# Patient Record
Sex: Female | Born: 1970
Health system: Southern US, Community
[De-identification: ages and names within clinical notes are randomized; demographics above are authoritative.]

## PROBLEM LIST (undated history)

## (undated) HISTORY — PX: BACK SURGERY: SHX140

---

## 1998-03-25 ENCOUNTER — Encounter: Admission: RE | Admit: 1998-03-25 | Discharge: 1998-06-23 | Payer: Self-pay | Admitting: *Deleted

## 1998-05-08 ENCOUNTER — Inpatient Hospital Stay (HOSPITAL_COMMUNITY): Admission: AD | Admit: 1998-05-08 | Discharge: 1998-05-11 | Payer: Self-pay | Admitting: *Deleted

## 1999-05-11 ENCOUNTER — Ambulatory Visit (HOSPITAL_COMMUNITY): Admission: RE | Admit: 1999-05-11 | Discharge: 1999-05-11 | Payer: Self-pay | Admitting: Family Medicine

## 1999-05-11 ENCOUNTER — Encounter: Payer: Self-pay | Admitting: Family Medicine

## 1999-09-14 ENCOUNTER — Inpatient Hospital Stay (HOSPITAL_COMMUNITY): Admission: AD | Admit: 1999-09-14 | Discharge: 1999-09-16 | Payer: Self-pay | Admitting: Obstetrics and Gynecology

## 1999-09-14 ENCOUNTER — Encounter: Payer: Self-pay | Admitting: Obstetrics and Gynecology

## 1999-10-05 ENCOUNTER — Inpatient Hospital Stay (HOSPITAL_COMMUNITY): Admission: AD | Admit: 1999-10-05 | Discharge: 1999-10-05 | Payer: Self-pay | Admitting: *Deleted

## 1999-10-12 ENCOUNTER — Inpatient Hospital Stay (HOSPITAL_COMMUNITY): Admission: AD | Admit: 1999-10-12 | Discharge: 1999-10-15 | Payer: Self-pay | Admitting: Obstetrics and Gynecology

## 2000-12-06 ENCOUNTER — Other Ambulatory Visit: Admission: RE | Admit: 2000-12-06 | Discharge: 2000-12-06 | Payer: Self-pay | Admitting: *Deleted

## 2000-12-23 ENCOUNTER — Other Ambulatory Visit: Admission: RE | Admit: 2000-12-23 | Discharge: 2000-12-23 | Payer: Self-pay | Admitting: *Deleted

## 2000-12-23 ENCOUNTER — Encounter (INDEPENDENT_AMBULATORY_CARE_PROVIDER_SITE_OTHER): Payer: Self-pay

## 2001-07-26 ENCOUNTER — Encounter: Payer: Self-pay | Admitting: Family Medicine

## 2001-07-26 ENCOUNTER — Ambulatory Visit (HOSPITAL_COMMUNITY): Admission: RE | Admit: 2001-07-26 | Discharge: 2001-07-26 | Payer: Self-pay | Admitting: Family Medicine

## 2001-12-08 ENCOUNTER — Other Ambulatory Visit: Admission: RE | Admit: 2001-12-08 | Discharge: 2001-12-08 | Payer: Self-pay | Admitting: *Deleted

## 2002-04-10 ENCOUNTER — Encounter: Payer: Self-pay | Admitting: Family Medicine

## 2002-04-10 ENCOUNTER — Ambulatory Visit (HOSPITAL_COMMUNITY): Admission: RE | Admit: 2002-04-10 | Discharge: 2002-04-10 | Payer: Self-pay | Admitting: Family Medicine

## 2002-04-15 ENCOUNTER — Encounter: Payer: Self-pay | Admitting: Urology

## 2002-04-15 ENCOUNTER — Ambulatory Visit (HOSPITAL_COMMUNITY): Admission: EM | Admit: 2002-04-15 | Discharge: 2002-04-15 | Payer: Self-pay | Admitting: Emergency Medicine

## 2002-12-19 ENCOUNTER — Other Ambulatory Visit: Admission: RE | Admit: 2002-12-19 | Discharge: 2002-12-19 | Payer: Self-pay | Admitting: Obstetrics and Gynecology

## 2003-12-30 ENCOUNTER — Other Ambulatory Visit: Admission: RE | Admit: 2003-12-30 | Discharge: 2003-12-30 | Payer: Self-pay | Admitting: Obstetrics and Gynecology

## 2008-07-11 ENCOUNTER — Emergency Department (HOSPITAL_COMMUNITY): Admission: EM | Admit: 2008-07-11 | Discharge: 2008-07-11 | Payer: Self-pay | Admitting: Emergency Medicine

## 2009-07-11 ENCOUNTER — Emergency Department (HOSPITAL_COMMUNITY): Admission: EM | Admit: 2009-07-11 | Discharge: 2009-07-11 | Payer: Self-pay | Admitting: Family Medicine

## 2009-08-01 ENCOUNTER — Ambulatory Visit (HOSPITAL_COMMUNITY): Admission: RE | Admit: 2009-08-01 | Discharge: 2009-08-01 | Payer: Self-pay | Admitting: Family Medicine

## 2009-09-29 ENCOUNTER — Encounter: Admission: RE | Admit: 2009-09-29 | Discharge: 2009-09-29 | Payer: Self-pay | Admitting: Family Medicine

## 2010-03-05 ENCOUNTER — Encounter: Payer: Self-pay | Admitting: Pulmonary Disease

## 2010-05-26 ENCOUNTER — Encounter: Payer: Self-pay | Admitting: Pulmonary Disease

## 2010-06-19 DIAGNOSIS — E7800 Pure hypercholesterolemia, unspecified: Secondary | ICD-10-CM | POA: Insufficient documentation

## 2010-06-19 DIAGNOSIS — E559 Vitamin D deficiency, unspecified: Secondary | ICD-10-CM | POA: Insufficient documentation

## 2010-06-19 DIAGNOSIS — J309 Allergic rhinitis, unspecified: Secondary | ICD-10-CM | POA: Insufficient documentation

## 2010-06-19 DIAGNOSIS — IMO0001 Reserved for inherently not codable concepts without codable children: Secondary | ICD-10-CM | POA: Insufficient documentation

## 2010-06-19 DIAGNOSIS — R799 Abnormal finding of blood chemistry, unspecified: Secondary | ICD-10-CM | POA: Insufficient documentation

## 2010-06-19 DIAGNOSIS — K5900 Constipation, unspecified: Secondary | ICD-10-CM | POA: Insufficient documentation

## 2010-06-19 DIAGNOSIS — N200 Calculus of kidney: Secondary | ICD-10-CM | POA: Insufficient documentation

## 2010-06-22 ENCOUNTER — Telehealth (INDEPENDENT_AMBULATORY_CARE_PROVIDER_SITE_OTHER): Payer: Self-pay | Admitting: *Deleted

## 2010-06-22 ENCOUNTER — Ambulatory Visit: Payer: Self-pay | Admitting: Pulmonary Disease

## 2010-06-22 DIAGNOSIS — R05 Cough: Secondary | ICD-10-CM

## 2010-06-22 DIAGNOSIS — R059 Cough, unspecified: Secondary | ICD-10-CM | POA: Insufficient documentation

## 2010-07-07 ENCOUNTER — Telehealth (INDEPENDENT_AMBULATORY_CARE_PROVIDER_SITE_OTHER): Payer: Self-pay | Admitting: *Deleted

## 2010-08-07 ENCOUNTER — Telehealth (INDEPENDENT_AMBULATORY_CARE_PROVIDER_SITE_OTHER): Payer: Self-pay | Admitting: *Deleted

## 2010-09-17 NOTE — Letter (Signed)
Summary: [facility]   Imported By: Sherian Rein 07/06/2010 08:19:22  _____________________________________________________________________  External Attachment:    Type:   Image     Comment:   External Document

## 2010-09-17 NOTE — Progress Notes (Signed)
Summary: MEDS-returning call--use cell #  Phone Note Call from Patient Call back at Home Phone [phone]   Caller: Patient Call For: [facility] Summary of Call: THE MEDS HELPED BUT SHOULD SHE CONTINUE ALL OF THEM Initial call taken by: Lacinda Axon,  July 07, 2010 3:58 PM  Follow-up for Phone Call        [facility] Vernie Murders  July 07, 2010 4:14 PM   Additional Follow-up for Phone Call Additional follow up Details #1::        PT RETURNING CALL.  Pt calling again w/ cell # [phone].  Please return call on cell# Lehman Prom  July 08, 2010 10:57 AM Additional Follow-up by: Rickard Patience,  July 08, 2010 9:19 AM    Additional Follow-up for Phone Call Additional follow up Details #2::    Spoke with pt and she states that her cough is much improved after using dexilant, prilosec, omnaris and chlorphen as directed. Pt wants to know if she has to continue all the medications, or can she stop some of them.  Pt states she does not wnat ot be on all these meds for the rest of her life. Please advise.Carron Curie CMA  July 08, 2010 3:23 PM   Additional Follow-up for Phone Call Additional follow up Details #3:: Details for Additional Follow-up Action Taken: would get her to stop nasal spray, but continue chlorpheniramine for another week.  If continues to do well, can take this just as needed for postnasal drip.  I think she needs to stay on something for reflux for a long period of time...can call in omeprazole 40mg  one each day.  If the cough returns, let me know. Additional Follow-up by: Barbaraann Share MD,  July 08, 2010 4:38 PM  New/Updated Medications: OMEPRAZOLE 40 MG CPDR (OMEPRAZOLE) take 1 by mouth once daily Prescriptions: OMEPRAZOLE 40 MG CPDR (OMEPRAZOLE) take 1 by mouth once daily  #30 x 5   Entered by:   Reynaldo Minium CMA   Authorized by:   Barbaraann Share MD   Signed by:   Reynaldo Minium CMA on 07/08/2010   Method used:   Electronically to        [facility]  [phone] (retail)       [address] [place], [place]  [address]       Ph: [phone] or [phone]       Fax: [phone]   RxID:   [id number]    Spoke with patient-aware of KC's recs and RX sent to pharmacy. Will call if cough returns.Reynaldo Minium CMA  July 08, 2010 4:47 PM

## 2010-09-17 NOTE — Assessment & Plan Note (Signed)
Summary: consult for chronic cough   Visit Type:  Initial Consult Copy to:  Holley Bouche MD Primary Provider/Referring Provider:  Holley Bouche MD  CC:  pulmonary consult. pt c/o dry cough x 1 year. pt states it happens at night in bed. .  History of Present Illness: the pt is a 39y/o female who I have been asked to see for chronic cough.  She has been having a dry cough for over one year, and states that it occurs almost exclusively at night upon lying down.  She begins to have a paroxysm once she lies down, and once her cough stops it doesn't occur anymore for the rest of the night.  She can have occasional cough during the day with heavy voice use.  She denies any postnasal drip or sinus pressure, and denies any breathing issues or h/o childhood asthma.  She does have some reflux symptoms, and has been on nexium for the last one month (but has run out).  It is unclear whether this helped or not.  She has had a cxr earlier in the year that was totally normal.    Current Medications (verified): 1)  Zovia 1/35e (28) 1-35 Mg-Mcg Tabs (Ethynodiol Diac-Eth Estradiol) .Marland Kitchen.. 1 Tablet Once Daily 2)  Nexium 40 Mg Cpdr (Esomeprazole Magnesium) .... Once Daily 3)  Budeprion Sr 150 Mg Xr12h-Tab (Bupropion Hcl) .... 1/2 Tablet Once Daily 4)  Benzonatate 100 Mg Caps (Benzonatate) .... One Tablet Three Times A Day As Needed For Cough  Allergies (verified): No Known Drug Allergies  Past History:  Risk Factors: Smoking Status: never (06/19/2010)  Past Medical History:  ANA POSITIVE (ICD-790.99)--seen by Levitin in the past. NEPHROLITHIASIS (ICD-592.0) PURE HYPERCHOLESTEROLEMIA (ICD-272.0) ALLERGIC RHINITIS CAUSE UNSPECIFIED (ICD-477.9)      Past Surgical History: back surgery laparoscopy  Family History: Reviewed history and no changes required. Stomach cancer--father lung cancer--father, pgm Breast cancer-- pgm  Social History: Reviewed history from 06/19/2010 and no changes  required. Patient never smoked.  married children--2 occupation--[employer]  Review of Systems       The patient complains of non-productive cough, anxiety, and joint stiffness or pain.  The patient denies shortness of breath with activity, shortness of breath at rest, productive cough, coughing up blood, chest pain, irregular heartbeats, acid heartburn, indigestion, loss of appetite, weight change, abdominal pain, difficulty swallowing, sore throat, tooth/dental problems, headaches, nasal congestion/difficulty breathing through nose, sneezing, itching, ear ache, depression, hand/feet swelling, rash, change in color of mucus, and fever.    Vital Signs:  Patient profile:   40 year old female Height:      66 inches Weight:      190.50 pounds BMI:     30.86 O2 Sat:      98 % on Room air Temp:     97.7 degrees F oral Pulse rate:   74 / minute BP sitting:   138 / 80  (left arm) Cuff size:   large  Vitals Entered By: Carver Fila (June 22, 2010 2:35 PM)  O2 Flow:  Room air CC: pulmonary consult. pt c/o dry cough x 1 year. pt states it happens at night in bed.  Comments meds and allergies updated Phone number updated Carver Fila  June 22, 2010 2:36 PM    Physical Exam  General:  ow female in nad  Eyes:  PERRLA and EOMI.   Nose:  patent without discharge no purulence seen Mouth:  clear, no lesions or exudates. Neck:  no jvd, tmg,  LN Lungs:  totally clear to auscultation Heart:  rrr, no mrg Abdomen:  soft and nontender, bs+ Extremities:  no edema noted, pulses intact distally no cyanosis  Neurologic:  alert and oriented, moves all 4.   Impression & Recommendations:  Problem # 1:  COUGH, CHRONIC (ICD-786.2)  the pt's cough is most c/w an upper rather than lower airway source by history.  The fact that it occurs almost exclusively in the evening on lying down makes postnasal drip and LPR the most likely culprits.  I would like to treat her for an upper airway cough  with more aggressive treatment of rhinitis and LPR.  Will also try behavioral techniques that help with a cyclical cough mechanism.  Medications Added to Medication List This Visit: 1)  Nexium 40 Mg Cpdr (Esomeprazole magnesium) .... Once daily 2)  Budeprion Sr 150 Mg Xr12h-tab (Bupropion hcl) .... 1/2 tablet once daily 3)  Benzonatate 100 Mg Caps (Benzonatate) .... One tablet three times a day as needed for cough  Other Orders: Consultation Level IV (44010)  Patient Instructions: 1)  take dexilant 60mg  one each night at bedtime.  Get prilosec otc 20mg  to take each am for the next 2 weeks. 2)  chlorpheniramine 8mg  each night at bedtime 3)  omnaris nasal spray 2 each nostril each am 4)  call me in 2 weeks to let me know if there is improvement.  I am looking for improvement here, not total resolution.   Immunization History:  Influenza Immunization History:    Influenza:  historical (05/16/2009)

## 2010-09-17 NOTE — Letter (Signed)
Summary: [facility]   Imported By: Sherian Rein 07/06/2010 08:17:26  _____________________________________________________________________  External Attachment:    Type:   Image     Comment:   External Document

## 2010-09-17 NOTE — Progress Notes (Signed)
Summary: talk to dr clance  Phone Note Call from Patient   Caller: Patient Call For: clance Summary of Call: pt want to no if she should stop taking medication that was given to her by her other drs. Initial call taken by: Rickard Patience,  June 22, 2010 4:37 PM  Follow-up for Phone Call        Spoke with pt and her question is she should contiue to take nexium and benzonate for her cough since you added new medicines at her visit Renold Genta RCP, LPN  June 22, 2010 4:54 PM   Additional Follow-up for Phone Call Additional follow up Details #1::        she told me she was out of nexium, so no, I don;t want her to take nexium.  see discharge instructions.  she can take tessalon pearls during the day if needed for cough Additional Follow-up by: Barbaraann Share MD,  June 22, 2010 4:58 PM    Additional Follow-up for Phone Call Additional follow up Details #2::    Spoke with pt and made her aware of medication instructions per Dr. Shelle Iron prilosec instead of nexium and tessalon perles only during the day. Renold Genta RCP, LPN  June 22, 2010 5:02 PM

## 2010-09-17 NOTE — Progress Notes (Signed)
Summary: dr for rhu arthritis  Phone Note Call from Patient   Caller: Patient Call For: clance Summary of Call: pt wants to know the name of the R[facility] dr that kc had recommended for her. [phone] Initial call taken by: Tivis Ringer, CNA,  August 07, 2010 12:52 PM  Follow-up for Phone Call        Pt states [facility] rec a rheumatologosts to her in the past and she cannot remember who it was. I advised KC out until tuesday but I will forward message to [facility] to advised on his return.Carron Curie CMA  August 07, 2010 1:04 PM  pt calling back regarding this message.  informed pt that Dr. Shelle Iron is out of the office until tomorrow.  pt was ok to wait until tomorrow to have message addressed.  Aundra Millet Reynolds LPN  August 11, 2010 10:22 AM   Additional Follow-up for Phone Call Additional follow up Details #1::        I am not sure who I have recommended in the past, but could consider Ozella Almond, or Orlin Hilding (who is in same practice as levitin who used to be her rheum.) Additional Follow-up by: Barbaraann Share MD,  August 12, 2010 2:04 PM    Additional Follow-up for Phone Call Additional follow up Details #2::    Spoke with pt and notified of the above recs per [facility].  Pt verbalized understanding. Follow-up by: Vernie Murders,  August 12, 2010 2:09 PM

## 2010-11-18 LAB — POCT URINALYSIS DIP (DEVICE)
Bilirubin Urine: NEGATIVE
Glucose, UA: NEGATIVE mg/dL
Nitrite: NEGATIVE
Protein, ur: 30 mg/dL — AB
Specific Gravity, Urine: >=1.03 (ref 1.005–1.030)
Urobilinogen, UA: 0.2 mg/dL (ref 0.0–1.0)
pH: 5.5 (ref 5.0–8.0)

## 2010-11-18 LAB — URINE CULTURE
Colony Count: NO GROWTH
Culture: NO GROWTH

## 2010-11-18 LAB — POCT PREGNANCY, URINE: Preg Test, Ur: NEGATIVE

## 2011-01-01 NOTE — Discharge Summary (Signed)
[facility]  Patient:    Allison Mullins, Allison Mullins                       MRN: [id number] Adm. Date:  [id number] Disc. Date: [id number] Attending:  Lenoard Aden                           Discharge Summary  ADMISSION DIAGNOSES:          1. Abnormal liver function tests.                               2. Intrahepatic cholestasis of pregnancy versus                                  early hemolysis, elevated liver enzymes, low                                  platelets syndrome.                               3. Intrauterine gestation at 36+ weeks.                               4. Positive Group B Strep culture.                               5. Positive antinuclear antibodies.  DISCHARGE DIAGNOSES:          1. Intrauterine gestation at 36+ weeks, delivered.                               2. Abnormal liver function tests.                               3. Intrahepatic cholestasis of pregnancy.  HISTORY OF PRESENT ILLNESS:   Please see Sung Amabile. Roslyn Smiling, M.D.s dictated history  and physical.  In essence, this is a 40 year old, gravida 4, para 1-0-2-1, female at 36+ weeks gestation admitted for induction of labor secondary to rising liver function tests and generalized pruritis.  The patients history is notable for a  positive ANA for which she has been on baby aspirin without any other laboratory studies to support antiphosphylipid antibody syndrome.  The patient had had several days history of generalized pruritis without any other etiology noted on evaluation.  LABORATORY DATA:              Liver studies of SGOT of 57, SGPT 64 on September 19, 1999.  Platelet count was in the 300,000 range.  The patient denied any clinical stigmata for preeclampsia.  She has been given cholestyramine, but the patient as unable to tolerate the medication.  On October 12, 1999, repeat labs were drawn and they were still noted to be remarkable for platelets normal, SGOT of  103, and SGPT of 119.  Her bile acid studies were still pending.  HOSPITAL COURSE:  The patient was admitted to [facility] on October 12, 1999.  Her blood pressure was 101/61.  Her platelet count on repeat was 301,000.  Her examination was notable for cervix being 1 cm, 50% effaced, -3 station, and posterior.  The patient had no clinical evidence that suggests pregnancy-induced hypertension.  She was started on penicillin prophylaxis.  Pitocin induction was begun.  This patient had planned an epidural anesthesia.  Tracing was reactive.  The patient was noted to have irregular contraction pattern.  Pitocin was continued.  On October 13, 1999, her cervix as 2 to 3 cm, 50% effaced, -2, and vertex presentation and blood pressures remained stable.  Repeat PIH labs were performed.  The labs showed platelet count of 249,000, hemoglobin 12.6, hematocrit 36.4, ALT of 164, ALP was 240, total bilirubin 1.4, uric acid 5.4.  AST was 144.  Creatinine was 0.8.  During the course of labor, the patient was still complaining of diffuse itching for which she received Nubane intravenously which helped to alleviate her symptoms.  The patient underwent artificial rupture of membranes and clear fluid was noted on October 13, 1999.  Pitocin was continued.  There was no indication for magnesium sulfate despite the elevated liver studies as the rest of the clinical presentation was not felt to be consistent with pregnancy-induced hypertension or preeclampsia.  The patient went into active labor and progressed quickly thereafter.  She became fully dilated t 10:30 a.m. and had a spontaneous vaginal delivery at 10:42 a.m. of a live female with a cord around the neck and body x 1.  Midline episiotomy was performed after a local anesthesia was utilized.  Apgars of 9 and 9.  Weight of the baby was 6 pounds 11 ounces.  Superficial markings in the periurethral area was  noted, but no evidence of bleeding, therefore, not reparative.  Episiotomy was without any extension and was repaired with 3-0 chromic in layers.  The baby was transferred to the nursery secondary to grunting.  Postpartum, the patient did well.  She continued to have itching which was responsive to Benadryl.  Repeat liver studies showed that the labs were decreasing.  The baby had been transferred to the NICU secondary to respiratory issues.  The patient who was rh negative received RhoGAM. Postpartum day #2, the patient was still complaining of itching, but was markedly decreased.  Repeat liver studies showed her AST of 70 down from a maximum of 144, ALT was 131 down from a maximum of 164.  The patient on postpartum day #2, otherwise doing well and examination that was unremarkable, was discharged to home.  DISPOSITION:                  Home.  CONDITION ON DISCHARGE:       Stable.  FOLLOW-UP:                    For repeat liver studies in one week in the office. Otherwise a postpartum visit in four to six weeks.  DISCHARGE INSTRUCTIONS:       Call for temperature greater than or equal to 100.4, soaking a regular pad every hour or more frequently, nothing per vagina for four to six weeks, call if headache not responsive to Tylenol, heartburn that is not responsive to TUMS, right upper quadrant pain, blurring of her vision, and swelling of the face, hands, and feet.  The baby remained in the NICU. DD:  11/08/99 TD:  11/09/99 Job: [id number]  [id number]

## 2011-01-01 NOTE — Op Note (Signed)
Allison Mullins, [place]                         ACCOUNT NO.:  [id number]   MEDICAL RECORD NO.:  [id number]                   PATIENT TYPE:  EMS   LOCATION:  ED                                   FACILITY:  [facility]   PHYSICIAN:  Lindaann Slough, M.D.               DATE OF BIRTH:  Aug 30, 1970   DATE OF PROCEDURE:  04/15/2002  DATE OF DISCHARGE:                                 OPERATIVE REPORT   PREOPERATIVE DIAGNOSIS:  Right ureteral stone.   POSTOPERATIVE DIAGNOSIS:  Right ureteral stone.   PROCEDURE DONE:  1. Cystoscopy.  2. Ureteroscopy with stone extraction.  3. Insertion of double-J catheter.   SURGEON:  Lindaann Slough, M.D.   ANESTHESIA:  General.   INDICATION:  The patient is a 40 year old female, who had severe right flank  pain associated with frequency and voiding small amount of urine at a time.  She was seen by her family physician four days ago for same symptoms.  A CT  scan then showed a 5 mm stone in the right and distal ureter.  A KUB done  today showed the stone in the same location.  Options were discussed with  the patient; continued conservative management versus stone extraction, and  she wishes to proceed with stone extraction.  The risks and benefits were  discussed with the patient and her husband.  They understand and are  agreeable.   DESCRIPTION OF PROCEDURE:  Under general anesthesia, the patient was prepped  and draped and placed into the dorsal lithotomy position.  A #22 Wappler  cystoscope was inserted in the bladder.  The bladder mucosa is normal.  There is no stone or tumor in the bladder.  The ureteral orifices are in  normal position and shape.  A guidewire was passed through the cystoscope  into the right ureter.  The cystoscope was then removed.  A #6.5 French  rigid ureteroscope was passed in the bladder and in the ureter.  A stone is  seen in the distal ureter, and a stone basket was passed through the  ureteroscope, and the stone was  extracted.  The ureteroscope was then re-  inserted in the ureter, and there was no evidence of ureteral injury.  The  ureteroscope was then removed.  The guidewire was then backloaded into the  cystoscope, and a #6 [place] - 24 double-J catheter was passed over the  guidewire with a proximal coil of the double-J catheter in the collecting  system.  The distal coil is in the bladder.  The bladder was then emptied and the cystoscope and guidewire were removed.   The patient tolerated the procedure well and left the OR in satisfactory  condition to postanesthesia care unit.  Lindaann Slough, M.D.    MN/MEDQ  D:  04/15/2002  T:  04/16/2002  Job:  [id number]

## 2011-01-30 ENCOUNTER — Other Ambulatory Visit: Payer: Self-pay | Admitting: Pulmonary Disease

## 2011-02-25 IMAGING — CR DG CHEST 2V
2 series · 2 of 2 positions shown · non-contrast
Comparison: None.

CLINICAL DATA: Cough

CHEST - 2 VIEW

[view not recorded (1 of 2)]
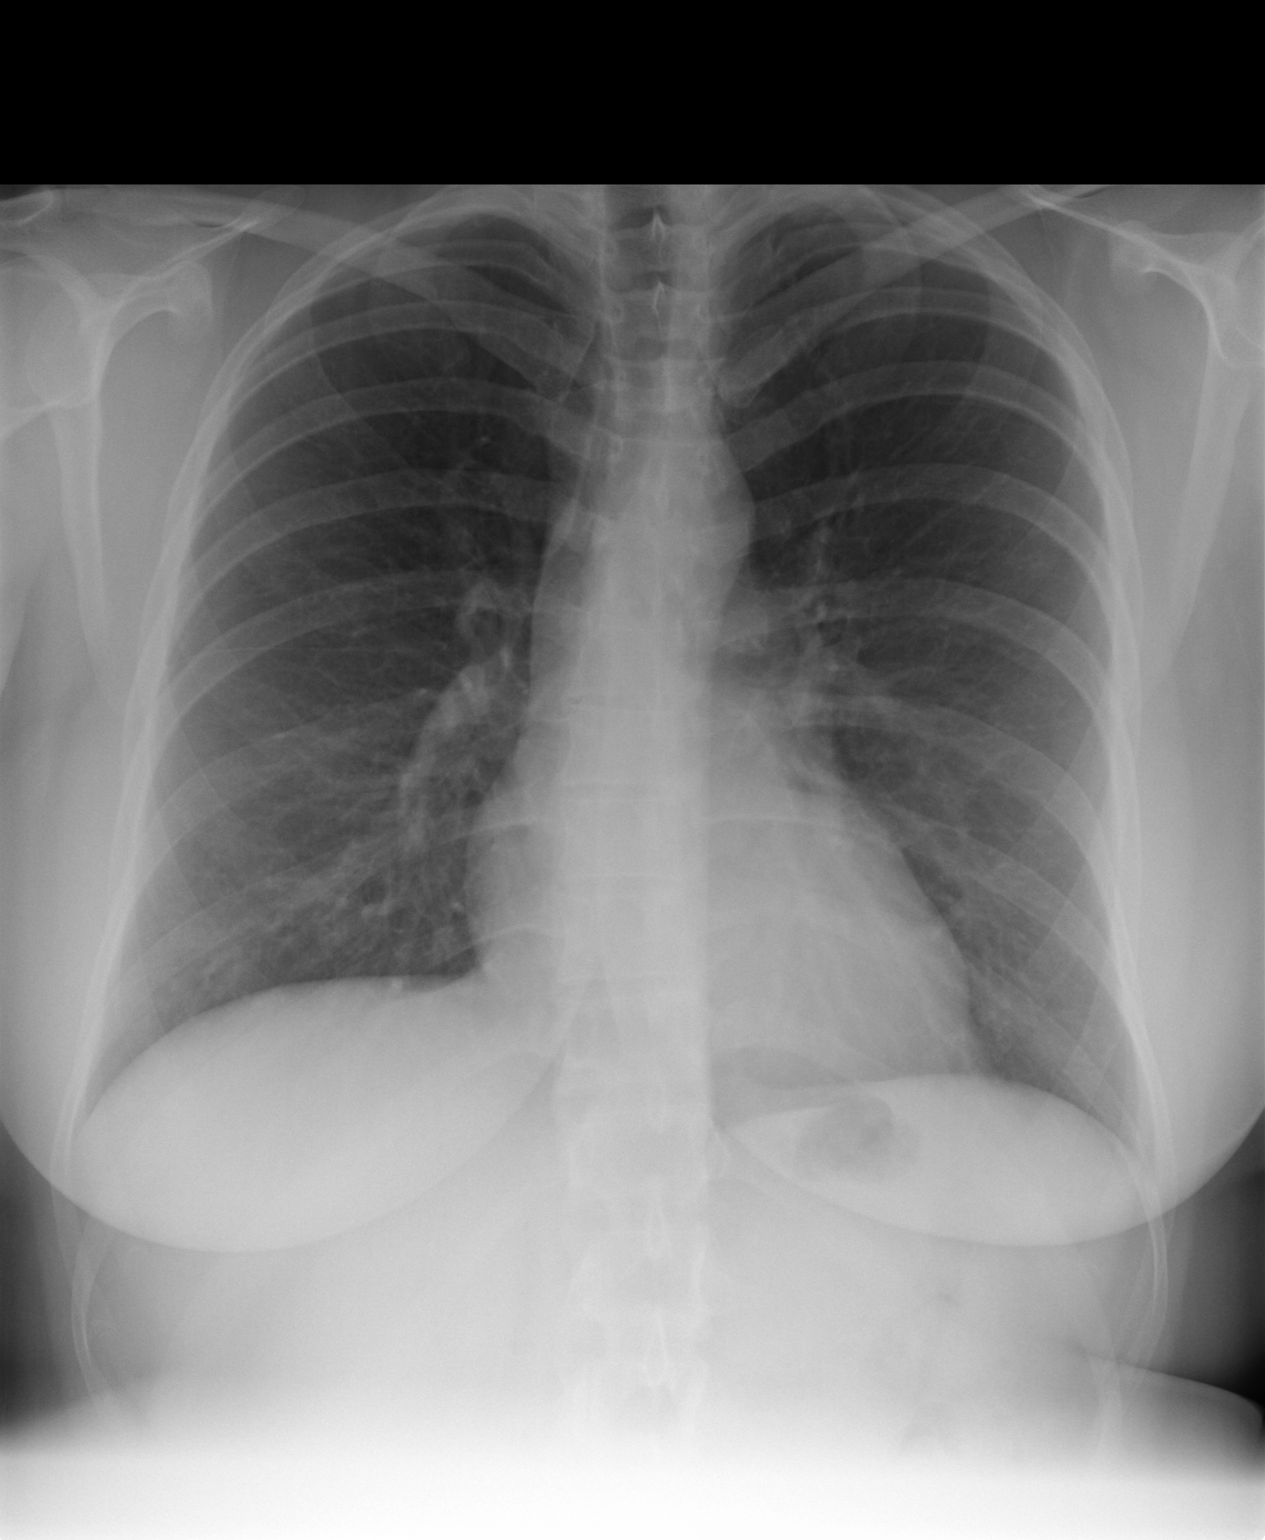

[view not recorded (2 of 2)]
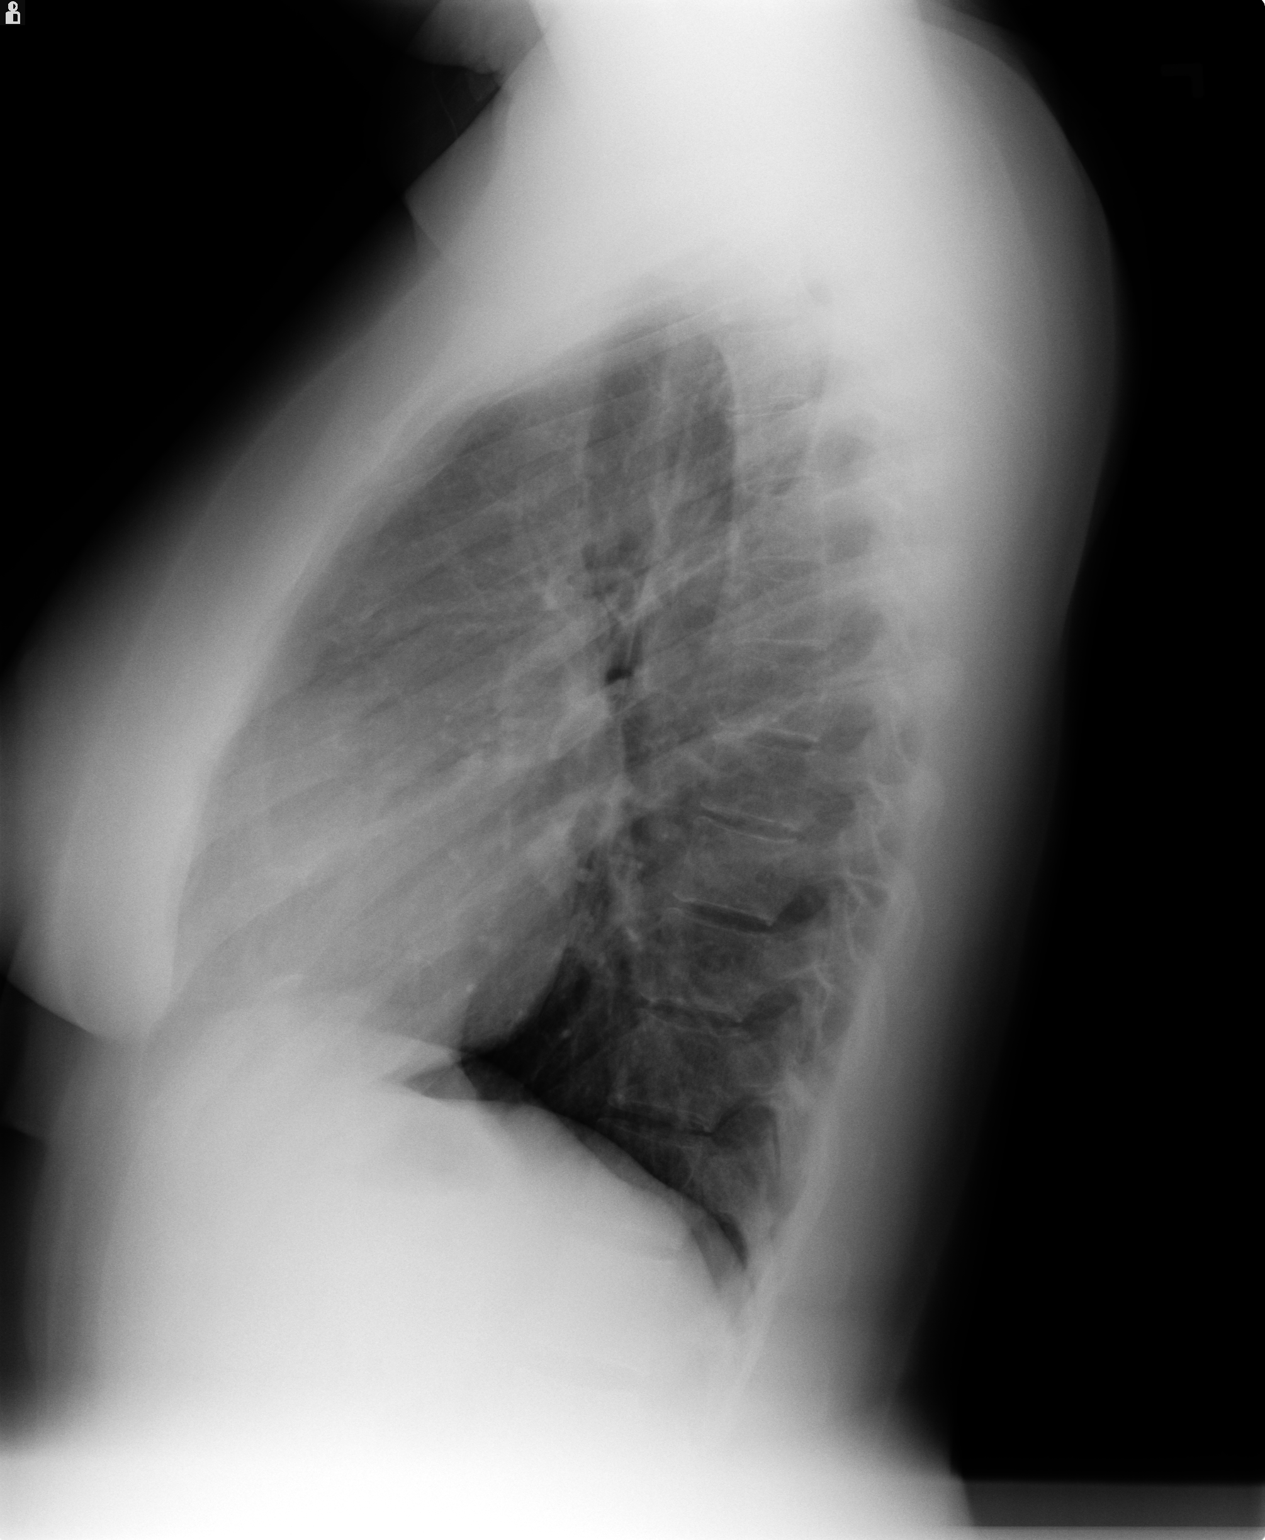

[2 of 2 positions shown; findings below may reference images not displayed]

FINDINGS: The heart size and mediastinal contours are within
normal limits.  Both lungs are clear.  The visualized skeletal
structures are unremarkable.
IMPRESSION: No active cardiopulmonary disease.

## 2011-08-06 ENCOUNTER — Other Ambulatory Visit: Payer: Self-pay | Admitting: Pulmonary Disease

## 2011-08-12 ENCOUNTER — Telehealth: Payer: Self-pay | Admitting: Pulmonary Disease

## 2011-08-12 MED ORDER — OMEPRAZOLE 40 MG PO CPDR: 40.0000 mg | DELAYED_RELEASE_CAPSULE | Freq: Every day | ORAL | Status: DC

## 2011-08-12 NOTE — Telephone Encounter (Signed)
Called and spoke with pt and she stated that the omeprazole is working for her.  She is requesting refills of this sent to the pharmacy.  Sent in #30 for the pt and she stated that she last saw [facility] in June when this was sent in and pt is requesting that the refills be sent in to last until June 2013.  KC please advise.  Pt is aware that [facility] out of the office until Monday.

## 2011-08-16 MED ORDER — OMEPRAZOLE 40 MG PO CPDR: 40.0000 mg | DELAYED_RELEASE_CAPSULE | Freq: Every day | ORAL | Status: DC

## 2011-08-16 NOTE — Telephone Encounter (Signed)
Ok with me if it is working.  So is this being denied?

## 2011-08-16 NOTE — Telephone Encounter (Signed)
I have called and added 6 refills to pt's rx. I have also fixed this in EPIC to show this. [email or web address] for pt aware

## 2011-08-18 NOTE — Telephone Encounter (Signed)
Pt aware. Denicia Pagliarulo, CMA  

## 2012-05-19 ENCOUNTER — Other Ambulatory Visit: Payer: Self-pay | Admitting: Pulmonary Disease

## 2012-05-23 ENCOUNTER — Other Ambulatory Visit: Payer: Self-pay | Admitting: Pulmonary Disease

## 2012-05-24 ENCOUNTER — Telehealth: Payer: Self-pay | Admitting: Pulmonary Disease

## 2012-05-24 MED ORDER — OMEPRAZOLE 40 MG PO CPDR: 40.0000 mg | DELAYED_RELEASE_CAPSULE | Freq: Every day | ORAL | Status: DC

## 2012-05-24 NOTE — Telephone Encounter (Signed)
Rx was refilled # 30 with no refills with note to pharmacist for the pt to keep appt for any future refills LMOM for the pt to be made aware

## 2012-05-24 NOTE — Telephone Encounter (Signed)
Pt has not been seen since 2011 and needs an appt for any refills.

## 2012-05-25 ENCOUNTER — Telehealth: Payer: Self-pay | Admitting: Pulmonary Disease

## 2012-05-25 MED ORDER — OMEPRAZOLE 40 MG PO CPDR: 40.0000 mg | DELAYED_RELEASE_CAPSULE | Freq: Every day | ORAL | Status: DC

## 2012-05-25 NOTE — Telephone Encounter (Signed)
Left a detailed msg to let the pt know her rx was sent to the pharmacy and apologized that this did not get taken care of for her yesterday. Told the pt there was no need for callback unless she has questions.

## 2012-06-22 ENCOUNTER — Encounter: Payer: Self-pay | Admitting: Pulmonary Disease

## 2012-06-22 ENCOUNTER — Ambulatory Visit (INDEPENDENT_AMBULATORY_CARE_PROVIDER_SITE_OTHER): Payer: Federal, State, Local not specified - PPO | Admitting: Pulmonary Disease

## 2012-06-22 VITALS — BP 116/82 | HR 76 | Temp 98.0°F | Ht 66.0 in | Wt 197.0 lb

## 2012-06-22 DIAGNOSIS — Z23 Encounter for immunization: Secondary | ICD-10-CM

## 2012-06-22 DIAGNOSIS — R059 Cough, unspecified: Secondary | ICD-10-CM

## 2012-06-22 DIAGNOSIS — R05 Cough: Secondary | ICD-10-CM

## 2012-06-22 MED ORDER — OMEPRAZOLE 40 MG PO CPDR: 40.0000 mg | DELAYED_RELEASE_CAPSULE | Freq: Every day | ORAL | Status: DC

## 2012-06-22 NOTE — Patient Instructions (Addendum)
Stay on omeprazole 40mg  each am, and will add dexilant 60mg  at bedtime for 2 weeks only.  Then go back to omeprazole once a day. Take chlorpheniramine 4mg   2 at bedtime for next 2 weeks, then can discontinue. If your cough does not resolve, please call me.  If it is doing well on once a day omeprazole, stay on this and see me back in one year.

## 2012-06-22 NOTE — Assessment & Plan Note (Signed)
The patient has a history of chronic cough that is felt to be upper airway in origin.  At her last visit, she was treated with a proton pump inhibitor and also for postnasal drip with resolution of her cough.  Her cough had been doing well until she ran out of her medication, but she has now been back on omeprazole for the last 4 weeks with no improvement in her cough.  I continue to believe this is upper airway in origin, and would like to work on more aggressive acid suppression and also behavioral therapies to prevent cyclical coughing.

## 2012-06-22 NOTE — Progress Notes (Signed)
  Subjective:    Patient ID: Allison Mullins, female    DOB: 28-Feb-1971, 41 y.o.   MRN: [id number]  HPI The patient comes in today for followup of her chronic cough, she has not been seen in 2 years, and has had a return of her cough.  At her first visit, she was treated with an antihistamine and aggressive acid suppression, with resolution of her cough.  She did well with once a day omeprazole until she ran out of the medication, with a return of her cough.  She has been back on the omeprazole for the last 4 weeks, but this has not been helping her symptoms.  This is primarily occurring at night upon lying down, and is clearly cyclical in character.  It is dry, and the patient admits that she clears her throat frequently.  She is unsure if she is having postnasal drip.   Review of Systems  Constitutional: Negative for fever and unexpected weight change.  HENT: Negative for ear pain, nosebleeds, congestion, sore throat, rhinorrhea, sneezing, trouble swallowing, dental problem, postnasal drip and sinus pressure.   Eyes: Negative for redness and itching.  Respiratory: Positive for cough. Negative for chest tightness, shortness of breath and wheezing.   Cardiovascular: Negative for palpitations and leg swelling.  Gastrointestinal: Negative for nausea and vomiting.  Genitourinary: Negative for dysuria.  Musculoskeletal: Negative for joint swelling.  Skin: Negative for rash.  Neurological: Negative for headaches.  Hematological: Does not bruise/bleed easily.  Psychiatric/Behavioral: Positive for dysphoric mood. The patient is not nervous/anxious.        Objective:   Physical Exam Obese female in no acute distress Nose without purulence or discharge noted Oropharynx clear Neck without lymphadenopathy or thyromegaly Chest totally clear to auscultation, no wheezing noted Cardiac exam was regular rate and rhythm Lower extremity without edema, no cyanosis Alert and oriented, moves all 4  extremities.       Assessment & Plan:

## 2013-04-15 ENCOUNTER — Emergency Department (HOSPITAL_BASED_OUTPATIENT_CLINIC_OR_DEPARTMENT_OTHER)
Admission: EM | Admit: 2013-04-15 | Discharge: 2013-04-15 | Disposition: A | Discharge status: Home / Self Care | Payer: Federal, State, Local not specified - PPO | Attending: Emergency Medicine | Admitting: Emergency Medicine

## 2013-04-15 ENCOUNTER — Encounter (HOSPITAL_BASED_OUTPATIENT_CLINIC_OR_DEPARTMENT_OTHER): Payer: Self-pay | Admitting: Emergency Medicine

## 2013-04-15 ENCOUNTER — Emergency Department (HOSPITAL_BASED_OUTPATIENT_CLINIC_OR_DEPARTMENT_OTHER): Payer: Federal, State, Local not specified - PPO

## 2013-04-15 DIAGNOSIS — S62639A Displaced fracture of distal phalanx of unspecified finger, initial encounter for closed fracture: Secondary | ICD-10-CM | POA: Insufficient documentation

## 2013-04-15 DIAGNOSIS — S62609A Fracture of unspecified phalanx of unspecified finger, initial encounter for closed fracture: Secondary | ICD-10-CM

## 2013-04-15 DIAGNOSIS — Y9361 Activity, american tackle football: Secondary | ICD-10-CM | POA: Insufficient documentation

## 2013-04-15 DIAGNOSIS — Y9229 Other specified public building as the place of occurrence of the external cause: Secondary | ICD-10-CM | POA: Insufficient documentation

## 2013-04-15 DIAGNOSIS — Z79899 Other long term (current) drug therapy: Secondary | ICD-10-CM | POA: Insufficient documentation

## 2013-04-15 DIAGNOSIS — W219XXA Striking against or struck by unspecified sports equipment, initial encounter: Secondary | ICD-10-CM | POA: Insufficient documentation

## 2013-04-15 NOTE — ED Provider Notes (Signed)
Medical screening examination/treatment/procedure(s) were performed by non-physician practitioner and as supervising physician I was immediately available for consultation/collaboration.  Kristen N Ward, DO 04/15/13 2312 

## 2013-04-15 NOTE — ED Provider Notes (Signed)
CSN: [id number]     Arrival date & time 04/15/13  1825 History   First MD Initiated Contact with Patient 04/15/13 1912     Chief Complaint  Patient presents with  . Finger Injury   (Consider location/radiation/quality/duration/timing/severity/associated sxs/prior Treatment) HPI  History reviewed. No pertinent past medical history. Past Surgical History  Procedure Laterality Date  . Back surgery     History reviewed. No pertinent family history. History  Substance Use Topics  . Smoking status: Never Smoker   . Smokeless tobacco: Not on file  . Alcohol Use: No   OB History   Grav Para Term Preterm Abortions TAB SAB Ect Mult Living                 Review of Systems  Allergies  Review of patient's allergies indicates no known allergies.  Home Medications   Current Outpatient Rx  Name  Route  Sig  Dispense  Refill  . buPROPion (WELLBUTRIN SR) 150 MG 12 hr tablet   Oral   Take 1 tablet by mouth Daily.         Colleen Can 1/20 1-20 MG-MCG tablet   Oral   Take 1 tablet by mouth Daily.         Marland Kitchen omeprazole (PRILOSEC) 40 MG capsule   Oral   Take 1 capsule (40 mg total) by mouth daily.   30 capsule   11    BP 130/65  Pulse 74  Temp(Src) 98 F (36.7 C)  Resp 18  SpO2 100% Physical Exam  ED Course  Procedures (including critical care time) Labs Review Labs Reviewed - No data to display Imaging Review Dg Hand Complete Left  04/15/2013   *RADIOLOGY REPORT*  Clinical Data: Football injury left hand.  LEFT HAND - COMPLETE 3+ VIEW  Comparison: Single view of the hands 09/23/2010.  Findings: There is dorsal plate avulsion fracture of the distal phalanx of the left ring finger.  The fracture is mildly distracted.  No other acute bony or joint abnormality is identified.  IMPRESSION: Dorsal plate avulsion fracture distal phalanx left ring finger.   Original Report Authenticated By: Holley Dexter, M.D.    MDM   1. Finger fracture, closed, initial encounter        Elson Areas, PA-C 04/15/13 1949

## 2013-04-15 NOTE — ED Notes (Signed)
Pt in radiology 

## 2013-04-15 NOTE — ED Provider Notes (Signed)
Medical screening examination/treatment/procedure(s) were performed by non-physician practitioner and as supervising physician I was immediately available for consultation/collaboration.  Anavey Coombes N Cortnie Ringel, DO 04/15/13 2312 

## 2013-04-15 NOTE — ED Notes (Signed)
Pt was throwing football, hit 4th finger of left hand, jammed, now with minimal swelling and limited ROM

## 2013-04-15 NOTE — ED Provider Notes (Signed)
CSN: [id number]     Arrival date & time 04/15/13  1825 History   First MD Initiated Contact with Patient 04/15/13 1912     Chief Complaint  Patient presents with  . Finger Injury   (Consider location/radiation/quality/duration/timing/severity/associated sxs/prior Treatment) Patient is a 42 y.o. female presenting with hand pain. The history is provided by the patient. No language interpreter was used.  Hand Pain This is a new problem. The current episode started today. The problem occurs constantly. The problem has been unchanged. Associated symptoms include joint swelling and myalgias. Nothing aggravates the symptoms. She has tried nothing for the symptoms. The treatment provided no relief.  Pt reports finger was hit by a football  History reviewed. No pertinent past medical history. Past Surgical History  Procedure Laterality Date  . Back surgery     History reviewed. No pertinent family history. History  Substance Use Topics  . Smoking status: Never Smoker   . Smokeless tobacco: Not on file  . Alcohol Use: No   OB History   Grav Para Term Preterm Abortions TAB SAB Ect Mult Living                 Review of Systems  Musculoskeletal: Positive for myalgias and joint swelling.  All other systems reviewed and are negative.    Allergies  Review of patient's allergies indicates no known allergies.  Home Medications   Current Outpatient Rx  Name  Route  Sig  Dispense  Refill  . buPROPion (WELLBUTRIN SR) 150 MG 12 hr tablet   Oral   Take 1 tablet by mouth Daily.         Colleen Can 1/20 1-20 MG-MCG tablet   Oral   Take 1 tablet by mouth Daily.         Marland Kitchen omeprazole (PRILOSEC) 40 MG capsule   Oral   Take 1 capsule (40 mg total) by mouth daily.   30 capsule   11    BP 130/65  Pulse 74  Temp(Src) 98 F (36.7 C)  Resp 18  SpO2 100% Physical Exam  Vitals reviewed. Constitutional: She is oriented to person, place, and time. She appears well-developed and  well-nourished.  Musculoskeletal: She exhibits tenderness.  Swollen distal tip left 4th finger,  nv and ns intact  Neurological: She is alert and oriented to person, place, and time. She has normal reflexes.  Skin: Skin is warm.  Psychiatric: She has a normal mood and affect.    ED Course  Procedures (including critical care time) Labs Review Labs Reviewed - No data to display Imaging Review Dg Hand Complete Left  04/15/2013   *RADIOLOGY REPORT*  Clinical Data: Football injury left hand.  LEFT HAND - COMPLETE 3+ VIEW  Comparison: Single view of the hands 09/23/2010.  Findings: There is dorsal plate avulsion fracture of the distal phalanx of the left ring finger.  The fracture is mildly distracted.  No other acute bony or joint abnormality is identified.  IMPRESSION: Dorsal plate avulsion fracture distal phalanx left ring finger.   Original Report Authenticated By: Holley Dexter, M.D.    MDM   1. Finger fracture, closed, initial encounter    Pt counseled on possibility of tendon injury.   Pt advised of need to see Ortho/Hand for evaluation,  Splint,  Pain meds declined.  Pt will take aleve    Elson Areas, PA-C 04/15/13 1940  Lonia Skinner [place], PA-C 04/15/13 1941  Lonia Skinner [place], PA-C 04/15/13 1942  Lonia Skinner  [place], [place] 04/15/13 1943

## 2013-06-15 ENCOUNTER — Other Ambulatory Visit: Payer: Self-pay | Admitting: Pulmonary Disease

## 2013-06-22 ENCOUNTER — Ambulatory Visit: Payer: Federal, State, Local not specified - PPO | Admitting: Pulmonary Disease

## 2013-07-25 ENCOUNTER — Ambulatory Visit: Payer: Federal, State, Local not specified - PPO | Admitting: Pulmonary Disease

## 2014-06-21 ENCOUNTER — Other Ambulatory Visit: Payer: Self-pay | Admitting: Pulmonary Disease

## 2016-02-02 DIAGNOSIS — D225 Melanocytic nevi of trunk: Secondary | ICD-10-CM | POA: Diagnosis not present

## 2016-02-02 DIAGNOSIS — Z85828 Personal history of other malignant neoplasm of skin: Secondary | ICD-10-CM | POA: Diagnosis not present

## 2016-02-02 DIAGNOSIS — L821 Other seborrheic keratosis: Secondary | ICD-10-CM | POA: Diagnosis not present

## 2016-05-03 DIAGNOSIS — Z23 Encounter for immunization: Secondary | ICD-10-CM | POA: Diagnosis not present

## 2016-05-03 DIAGNOSIS — R202 Paresthesia of skin: Secondary | ICD-10-CM | POA: Diagnosis not present

## 2016-05-03 DIAGNOSIS — R5383 Other fatigue: Secondary | ICD-10-CM | POA: Diagnosis not present

## 2016-05-03 DIAGNOSIS — F324 Major depressive disorder, single episode, in partial remission: Secondary | ICD-10-CM | POA: Diagnosis not present

## 2016-05-03 DIAGNOSIS — E559 Vitamin D deficiency, unspecified: Secondary | ICD-10-CM | POA: Diagnosis not present

## 2016-05-17 DIAGNOSIS — N926 Irregular menstruation, unspecified: Secondary | ICD-10-CM | POA: Diagnosis not present

## 2016-05-17 DIAGNOSIS — Z01419 Encounter for gynecological examination (general) (routine) without abnormal findings: Secondary | ICD-10-CM | POA: Diagnosis not present

## 2016-05-17 DIAGNOSIS — Z6835 Body mass index (BMI) 35.0-35.9, adult: Secondary | ICD-10-CM | POA: Diagnosis not present

## 2016-05-17 DIAGNOSIS — Z1231 Encounter for screening mammogram for malignant neoplasm of breast: Secondary | ICD-10-CM | POA: Diagnosis not present

## 2016-05-18 DIAGNOSIS — K080 Exfoliation of teeth due to systemic causes: Secondary | ICD-10-CM | POA: Diagnosis not present

## 2016-06-25 DIAGNOSIS — K080 Exfoliation of teeth due to systemic causes: Secondary | ICD-10-CM | POA: Diagnosis not present

## 2016-07-29 DIAGNOSIS — K080 Exfoliation of teeth due to systemic causes: Secondary | ICD-10-CM | POA: Diagnosis not present

## 2016-07-30 DIAGNOSIS — K080 Exfoliation of teeth due to systemic causes: Secondary | ICD-10-CM | POA: Diagnosis not present

## 2016-11-30 DIAGNOSIS — K080 Exfoliation of teeth due to systemic causes: Secondary | ICD-10-CM | POA: Diagnosis not present

## 2016-12-02 DIAGNOSIS — M79629 Pain in unspecified upper arm: Secondary | ICD-10-CM | POA: Diagnosis not present

## 2016-12-21 DIAGNOSIS — R590 Localized enlarged lymph nodes: Secondary | ICD-10-CM | POA: Diagnosis not present

## 2016-12-30 DIAGNOSIS — R590 Localized enlarged lymph nodes: Secondary | ICD-10-CM | POA: Diagnosis not present

## 2016-12-31 ENCOUNTER — Other Ambulatory Visit: Payer: Self-pay | Admitting: Family Medicine

## 2016-12-31 ENCOUNTER — Ambulatory Visit
Admission: RE | Admit: 2016-12-31 | Discharge: 2016-12-31 | Disposition: A | Discharge status: Home / Self Care | Payer: Federal, State, Local not specified - PPO | Source: Ambulatory Visit | Attending: Family Medicine | Admitting: Family Medicine

## 2016-12-31 DIAGNOSIS — R590 Localized enlarged lymph nodes: Secondary | ICD-10-CM

## 2017-01-04 DIAGNOSIS — R590 Localized enlarged lymph nodes: Secondary | ICD-10-CM | POA: Diagnosis not present

## 2017-01-07 ENCOUNTER — Other Ambulatory Visit: Payer: Self-pay | Admitting: Family Medicine

## 2017-01-07 DIAGNOSIS — R590 Localized enlarged lymph nodes: Secondary | ICD-10-CM

## 2017-01-11 ENCOUNTER — Other Ambulatory Visit: Payer: Self-pay | Admitting: Family Medicine

## 2017-01-11 DIAGNOSIS — R590 Localized enlarged lymph nodes: Secondary | ICD-10-CM

## 2017-01-14 ENCOUNTER — Ambulatory Visit
Admission: RE | Admit: 2017-01-14 | Discharge: 2017-01-14 | Disposition: A | Discharge status: Home / Self Care | Payer: Federal, State, Local not specified - PPO | Source: Ambulatory Visit | Attending: Family Medicine | Admitting: Family Medicine

## 2017-01-14 ENCOUNTER — Other Ambulatory Visit: Payer: Federal, State, Local not specified - PPO

## 2017-01-14 DIAGNOSIS — R590 Localized enlarged lymph nodes: Secondary | ICD-10-CM

## 2017-01-14 DIAGNOSIS — R229 Localized swelling, mass and lump, unspecified: Secondary | ICD-10-CM | POA: Diagnosis not present

## 2017-02-10 DIAGNOSIS — L821 Other seborrheic keratosis: Secondary | ICD-10-CM | POA: Diagnosis not present

## 2017-02-10 DIAGNOSIS — D2271 Melanocytic nevi of right lower limb, including hip: Secondary | ICD-10-CM | POA: Diagnosis not present

## 2017-02-10 DIAGNOSIS — M79629 Pain in unspecified upper arm: Secondary | ICD-10-CM | POA: Diagnosis not present

## 2017-02-10 DIAGNOSIS — D239 Other benign neoplasm of skin, unspecified: Secondary | ICD-10-CM | POA: Diagnosis not present

## 2017-03-01 DIAGNOSIS — Z6837 Body mass index (BMI) 37.0-37.9, adult: Secondary | ICD-10-CM | POA: Diagnosis not present

## 2017-03-01 DIAGNOSIS — R635 Abnormal weight gain: Secondary | ICD-10-CM | POA: Diagnosis not present

## 2017-03-01 DIAGNOSIS — E669 Obesity, unspecified: Secondary | ICD-10-CM | POA: Diagnosis not present

## 2017-03-01 DIAGNOSIS — R5382 Chronic fatigue, unspecified: Secondary | ICD-10-CM | POA: Diagnosis not present

## 2017-03-02 DIAGNOSIS — R635 Abnormal weight gain: Secondary | ICD-10-CM | POA: Diagnosis not present

## 2017-03-04 DIAGNOSIS — R635 Abnormal weight gain: Secondary | ICD-10-CM | POA: Diagnosis not present

## 2017-06-21 DIAGNOSIS — Z6835 Body mass index (BMI) 35.0-35.9, adult: Secondary | ICD-10-CM | POA: Diagnosis not present

## 2017-06-21 DIAGNOSIS — Z01419 Encounter for gynecological examination (general) (routine) without abnormal findings: Secondary | ICD-10-CM | POA: Diagnosis not present

## 2017-08-10 DIAGNOSIS — Z0000 Encounter for general adult medical examination without abnormal findings: Secondary | ICD-10-CM | POA: Diagnosis not present

## 2017-08-10 DIAGNOSIS — Z13220 Encounter for screening for lipoid disorders: Secondary | ICD-10-CM | POA: Diagnosis not present

## 2017-08-10 DIAGNOSIS — Z130 Encounter for screening for diseases of the blood and blood-forming organs and certain disorders involving the immune mechanism: Secondary | ICD-10-CM | POA: Diagnosis not present

## 2017-08-10 DIAGNOSIS — Z1329 Encounter for screening for other suspected endocrine disorder: Secondary | ICD-10-CM | POA: Diagnosis not present

## 2017-08-10 DIAGNOSIS — Z833 Family history of diabetes mellitus: Secondary | ICD-10-CM | POA: Diagnosis not present

## 2017-10-24 DIAGNOSIS — I10 Essential (primary) hypertension: Secondary | ICD-10-CM | POA: Diagnosis not present

## 2017-10-24 DIAGNOSIS — Z832 Family history of diseases of the blood and blood-forming organs and certain disorders involving the immune mechanism: Secondary | ICD-10-CM | POA: Diagnosis not present

## 2017-10-24 DIAGNOSIS — N183 Chronic kidney disease, stage 3 (moderate): Secondary | ICD-10-CM | POA: Diagnosis not present

## 2017-10-24 DIAGNOSIS — K219 Gastro-esophageal reflux disease without esophagitis: Secondary | ICD-10-CM | POA: Diagnosis not present

## 2018-02-23 DIAGNOSIS — K080 Exfoliation of teeth due to systemic causes: Secondary | ICD-10-CM | POA: Diagnosis not present

## 2018-03-13 DIAGNOSIS — M5136 Other intervertebral disc degeneration, lumbar region: Secondary | ICD-10-CM | POA: Diagnosis not present

## 2018-03-13 DIAGNOSIS — M545 Low back pain: Secondary | ICD-10-CM | POA: Diagnosis not present

## 2018-03-15 DIAGNOSIS — D2271 Melanocytic nevi of right lower limb, including hip: Secondary | ICD-10-CM | POA: Diagnosis not present

## 2018-03-15 DIAGNOSIS — D225 Melanocytic nevi of trunk: Secondary | ICD-10-CM | POA: Diagnosis not present

## 2018-03-15 DIAGNOSIS — Z85828 Personal history of other malignant neoplasm of skin: Secondary | ICD-10-CM | POA: Diagnosis not present

## 2018-03-15 DIAGNOSIS — D485 Neoplasm of uncertain behavior of skin: Secondary | ICD-10-CM | POA: Diagnosis not present

## 2018-03-15 DIAGNOSIS — D2372 Other benign neoplasm of skin of left lower limb, including hip: Secondary | ICD-10-CM | POA: Diagnosis not present

## 2018-04-29 DIAGNOSIS — M79671 Pain in right foot: Secondary | ICD-10-CM | POA: Diagnosis not present

## 2018-05-27 DIAGNOSIS — Z23 Encounter for immunization: Secondary | ICD-10-CM | POA: Diagnosis not present

## 2018-05-29 IMAGING — DX DG CHEST 2V
2 series · 2 of 2 positions shown · non-contrast
Comparison: None.

CLINICAL DATA: Swollen axillary lymph nodes for 3 months

EXAM:
CHEST  2 VIEW

[dg chest 2 view (1 of 2)]
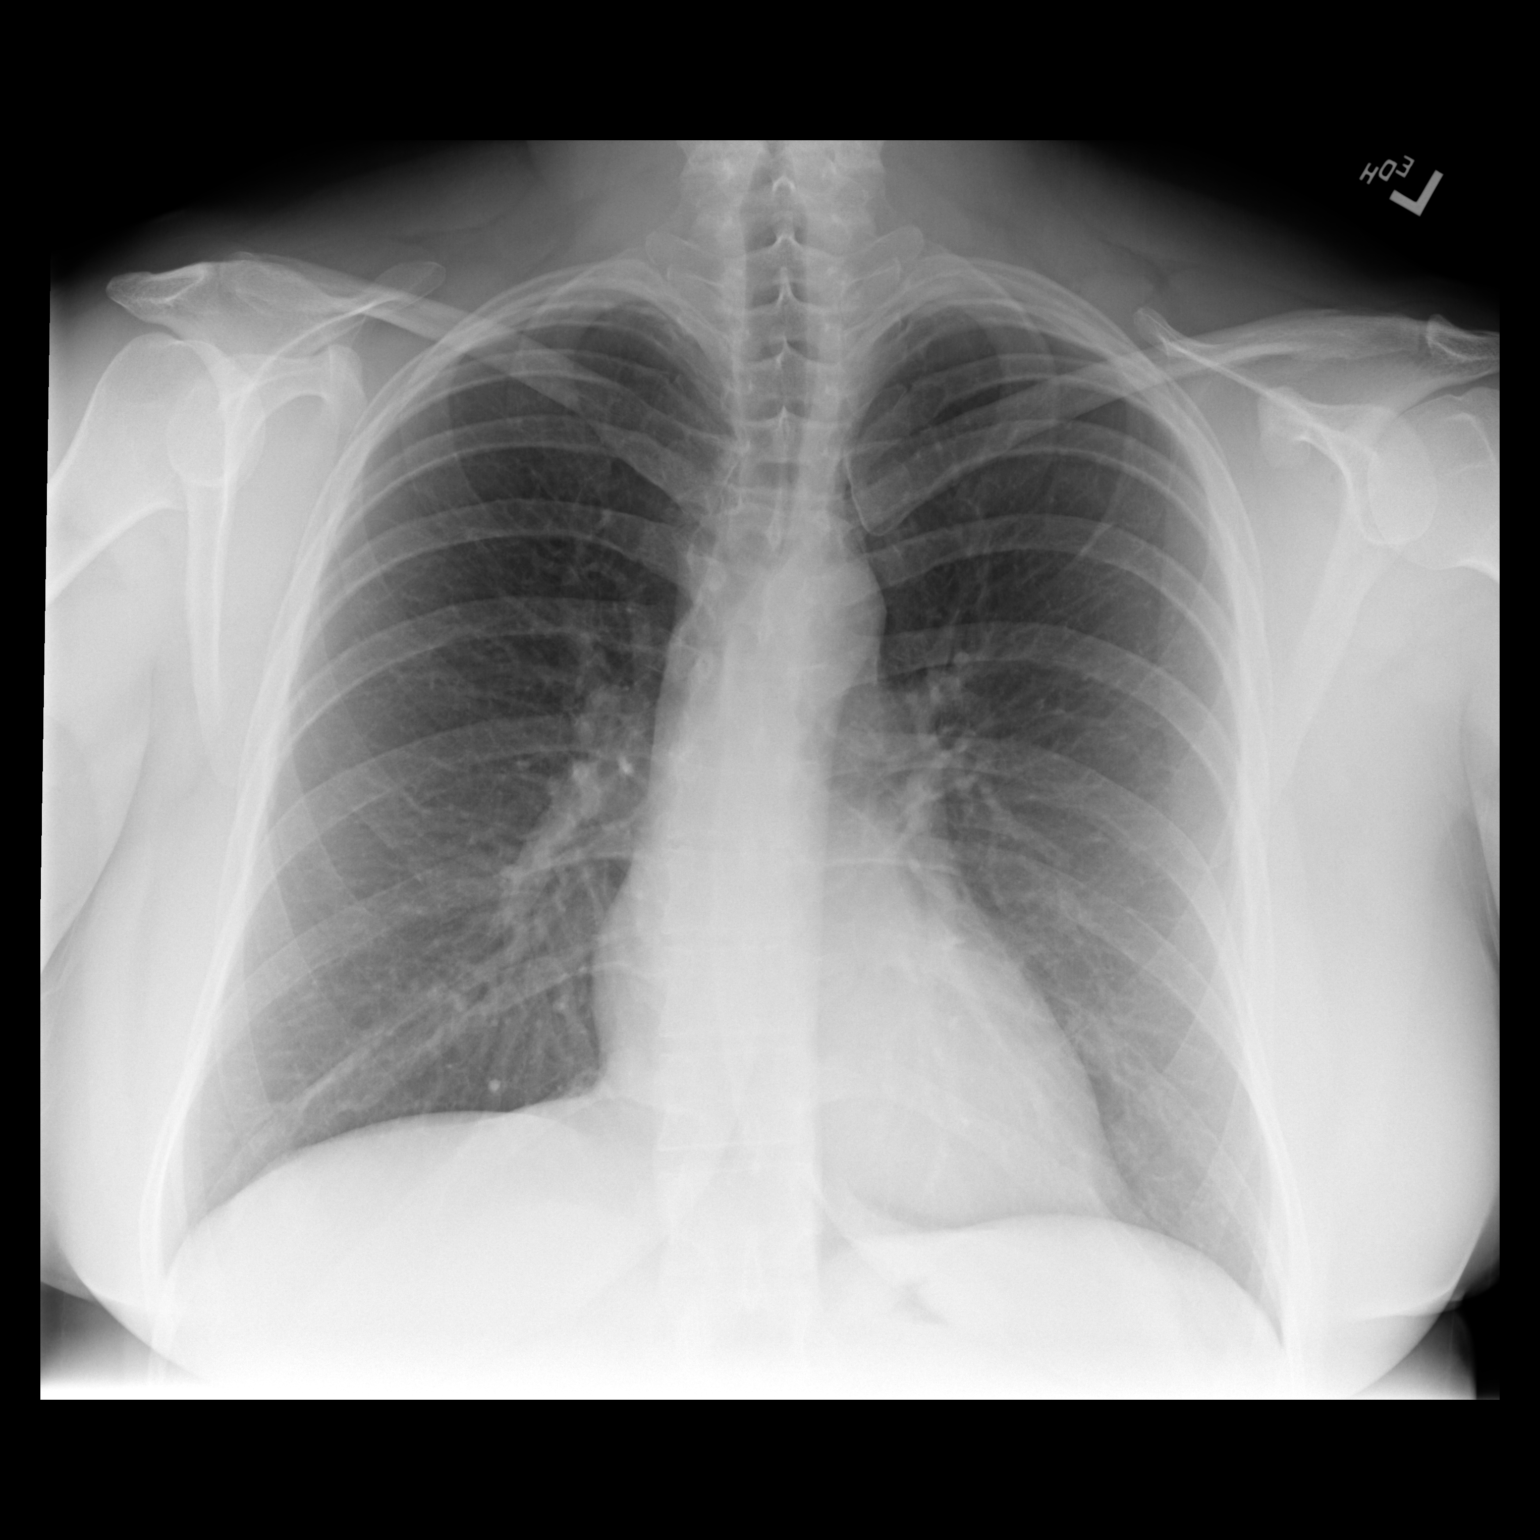

[dg chest 2 view (2 of 2)]
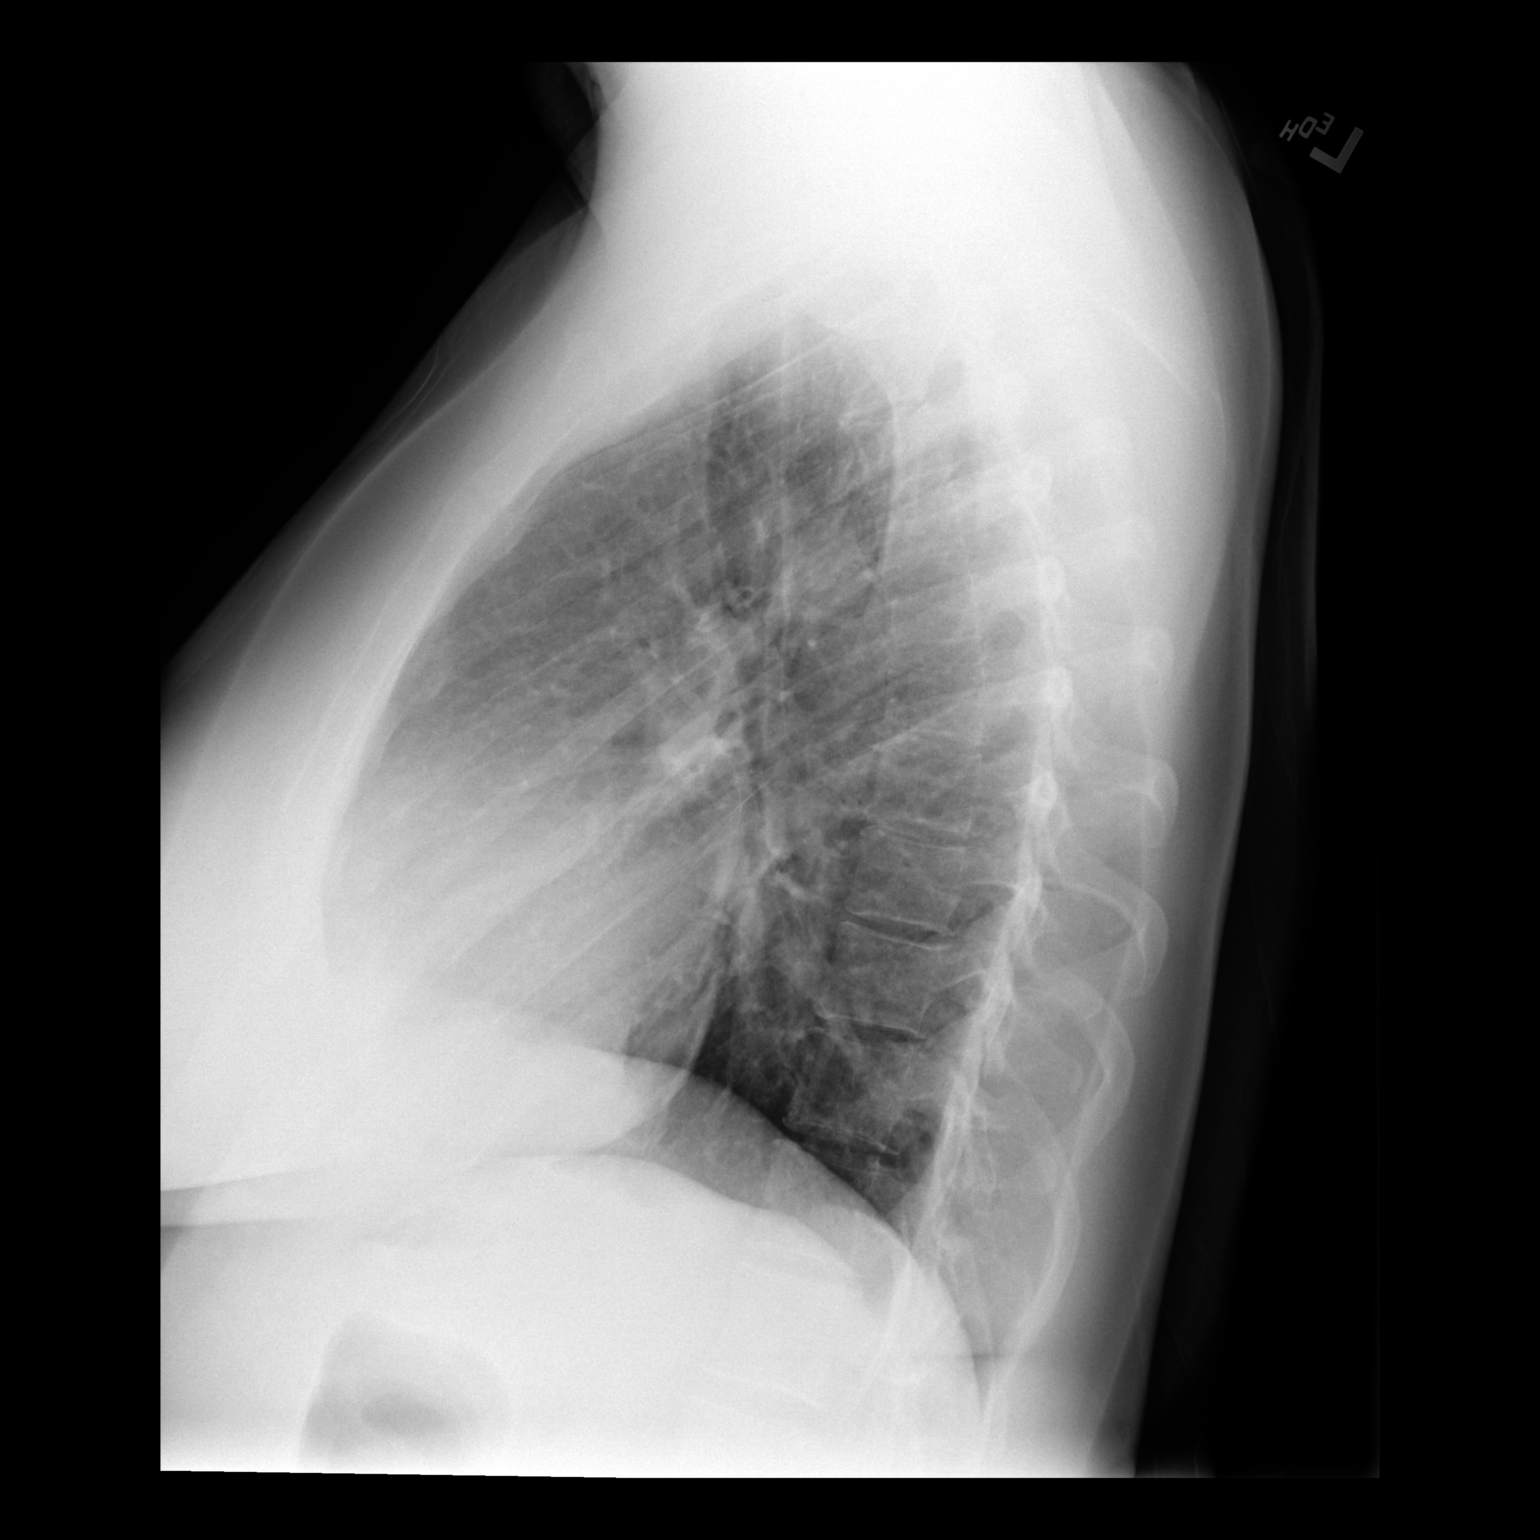

[2 of 2 positions shown; findings below may reference images not displayed]

FINDINGS: The heart size and mediastinal contours are within normal limits.
Both lungs are clear. The visualized skeletal structures are
unremarkable.
IMPRESSION: No active cardiopulmonary disease.

## 2018-06-22 DIAGNOSIS — Z1151 Encounter for screening for human papillomavirus (HPV): Secondary | ICD-10-CM | POA: Diagnosis not present

## 2018-06-22 DIAGNOSIS — Z01419 Encounter for gynecological examination (general) (routine) without abnormal findings: Secondary | ICD-10-CM | POA: Diagnosis not present

## 2018-06-22 DIAGNOSIS — Z1231 Encounter for screening mammogram for malignant neoplasm of breast: Secondary | ICD-10-CM | POA: Diagnosis not present

## 2018-06-22 DIAGNOSIS — Z6834 Body mass index (BMI) 34.0-34.9, adult: Secondary | ICD-10-CM | POA: Diagnosis not present

## 2018-06-28 DIAGNOSIS — H6692 Otitis media, unspecified, left ear: Secondary | ICD-10-CM | POA: Diagnosis not present

## 2018-08-10 DIAGNOSIS — K219 Gastro-esophageal reflux disease without esophagitis: Secondary | ICD-10-CM | POA: Diagnosis not present

## 2018-08-10 DIAGNOSIS — I10 Essential (primary) hypertension: Secondary | ICD-10-CM | POA: Diagnosis not present

## 2018-08-10 DIAGNOSIS — N183 Chronic kidney disease, stage 3 (moderate): Secondary | ICD-10-CM | POA: Diagnosis not present

## 2018-08-10 DIAGNOSIS — F324 Major depressive disorder, single episode, in partial remission: Secondary | ICD-10-CM | POA: Diagnosis not present

## 2018-08-23 DIAGNOSIS — K080 Exfoliation of teeth due to systemic causes: Secondary | ICD-10-CM | POA: Diagnosis not present

## 2018-09-15 DIAGNOSIS — N183 Chronic kidney disease, stage 3 (moderate): Secondary | ICD-10-CM | POA: Diagnosis not present

## 2018-09-15 DIAGNOSIS — Z13220 Encounter for screening for lipoid disorders: Secondary | ICD-10-CM | POA: Diagnosis not present

## 2018-09-15 DIAGNOSIS — I10 Essential (primary) hypertension: Secondary | ICD-10-CM | POA: Diagnosis not present

## 2018-10-17 DIAGNOSIS — K080 Exfoliation of teeth due to systemic causes: Secondary | ICD-10-CM | POA: Diagnosis not present

## 2019-03-21 DIAGNOSIS — M542 Cervicalgia: Secondary | ICD-10-CM | POA: Diagnosis not present

## 2019-03-21 DIAGNOSIS — M5412 Radiculopathy, cervical region: Secondary | ICD-10-CM | POA: Diagnosis not present

## 2019-03-21 DIAGNOSIS — M62830 Muscle spasm of back: Secondary | ICD-10-CM | POA: Diagnosis not present

## 2019-06-28 DIAGNOSIS — Z0000 Encounter for general adult medical examination without abnormal findings: Secondary | ICD-10-CM | POA: Diagnosis not present

## 2019-06-28 DIAGNOSIS — Z130 Encounter for screening for diseases of the blood and blood-forming organs and certain disorders involving the immune mechanism: Secondary | ICD-10-CM | POA: Diagnosis not present

## 2019-06-28 DIAGNOSIS — Z6834 Body mass index (BMI) 34.0-34.9, adult: Secondary | ICD-10-CM | POA: Diagnosis not present

## 2019-06-28 DIAGNOSIS — Z833 Family history of diabetes mellitus: Secondary | ICD-10-CM | POA: Diagnosis not present

## 2019-06-28 DIAGNOSIS — Z01419 Encounter for gynecological examination (general) (routine) without abnormal findings: Secondary | ICD-10-CM | POA: Diagnosis not present

## 2019-06-28 DIAGNOSIS — Z1231 Encounter for screening mammogram for malignant neoplasm of breast: Secondary | ICD-10-CM | POA: Diagnosis not present

## 2019-07-11 DIAGNOSIS — I10 Essential (primary) hypertension: Secondary | ICD-10-CM | POA: Diagnosis not present

## 2019-07-11 DIAGNOSIS — K219 Gastro-esophageal reflux disease without esophagitis: Secondary | ICD-10-CM | POA: Diagnosis not present

## 2019-07-11 DIAGNOSIS — E559 Vitamin D deficiency, unspecified: Secondary | ICD-10-CM | POA: Diagnosis not present

## 2019-07-11 DIAGNOSIS — N183 Chronic kidney disease, stage 3 unspecified: Secondary | ICD-10-CM | POA: Diagnosis not present

## 2019-07-11 DIAGNOSIS — R350 Frequency of micturition: Secondary | ICD-10-CM | POA: Diagnosis not present

## 2019-07-13 DIAGNOSIS — R350 Frequency of micturition: Secondary | ICD-10-CM | POA: Diagnosis not present

## 2019-10-29 DIAGNOSIS — S60450A Superficial foreign body of right index finger, initial encounter: Secondary | ICD-10-CM | POA: Diagnosis not present

## 2020-07-03 DIAGNOSIS — F324 Major depressive disorder, single episode, in partial remission: Secondary | ICD-10-CM | POA: Diagnosis not present

## 2020-07-03 DIAGNOSIS — E7800 Pure hypercholesterolemia, unspecified: Secondary | ICD-10-CM | POA: Diagnosis not present

## 2020-07-03 DIAGNOSIS — Z01419 Encounter for gynecological examination (general) (routine) without abnormal findings: Secondary | ICD-10-CM | POA: Diagnosis not present

## 2020-07-03 DIAGNOSIS — I10 Essential (primary) hypertension: Secondary | ICD-10-CM | POA: Diagnosis not present

## 2020-07-03 DIAGNOSIS — Z6835 Body mass index (BMI) 35.0-35.9, adult: Secondary | ICD-10-CM | POA: Diagnosis not present

## 2020-07-03 DIAGNOSIS — K219 Gastro-esophageal reflux disease without esophagitis: Secondary | ICD-10-CM | POA: Diagnosis not present

## 2020-07-03 DIAGNOSIS — Z1231 Encounter for screening mammogram for malignant neoplasm of breast: Secondary | ICD-10-CM | POA: Diagnosis not present

## 2020-07-25 DIAGNOSIS — S20211A Contusion of right front wall of thorax, initial encounter: Secondary | ICD-10-CM | POA: Diagnosis not present

## 2021-01-08 DIAGNOSIS — H6501 Acute serous otitis media, right ear: Secondary | ICD-10-CM | POA: Diagnosis not present

## 2021-01-08 DIAGNOSIS — H66002 Acute suppurative otitis media without spontaneous rupture of ear drum, left ear: Secondary | ICD-10-CM | POA: Diagnosis not present

## 2021-05-27 DIAGNOSIS — H93293 Other abnormal auditory perceptions, bilateral: Secondary | ICD-10-CM | POA: Diagnosis not present

## 2021-07-02 DIAGNOSIS — Z1231 Encounter for screening mammogram for malignant neoplasm of breast: Secondary | ICD-10-CM | POA: Diagnosis not present

## 2021-07-02 DIAGNOSIS — Z13220 Encounter for screening for lipoid disorders: Secondary | ICD-10-CM | POA: Diagnosis not present

## 2021-07-02 DIAGNOSIS — Z113 Encounter for screening for infections with a predominantly sexual mode of transmission: Secondary | ICD-10-CM | POA: Diagnosis not present

## 2021-07-02 DIAGNOSIS — Z0000 Encounter for general adult medical examination without abnormal findings: Secondary | ICD-10-CM | POA: Diagnosis not present

## 2021-07-02 DIAGNOSIS — Z124 Encounter for screening for malignant neoplasm of cervix: Secondary | ICD-10-CM | POA: Diagnosis not present

## 2021-07-02 DIAGNOSIS — Z01411 Encounter for gynecological examination (general) (routine) with abnormal findings: Secondary | ICD-10-CM | POA: Diagnosis not present

## 2021-07-02 DIAGNOSIS — Z1329 Encounter for screening for other suspected endocrine disorder: Secondary | ICD-10-CM | POA: Diagnosis not present

## 2021-07-02 DIAGNOSIS — Z01419 Encounter for gynecological examination (general) (routine) without abnormal findings: Secondary | ICD-10-CM | POA: Diagnosis not present

## 2021-07-02 DIAGNOSIS — Z6837 Body mass index (BMI) 37.0-37.9, adult: Secondary | ICD-10-CM | POA: Diagnosis not present

## 2021-07-02 DIAGNOSIS — Z131 Encounter for screening for diabetes mellitus: Secondary | ICD-10-CM | POA: Diagnosis not present

## 2021-07-06 ENCOUNTER — Other Ambulatory Visit: Payer: Self-pay | Admitting: Obstetrics and Gynecology

## 2021-07-06 DIAGNOSIS — R928 Other abnormal and inconclusive findings on diagnostic imaging of breast: Secondary | ICD-10-CM

## 2021-07-06 DIAGNOSIS — N6489 Other specified disorders of breast: Secondary | ICD-10-CM | POA: Diagnosis not present

## 2021-07-06 DIAGNOSIS — N6012 Diffuse cystic mastopathy of left breast: Secondary | ICD-10-CM | POA: Diagnosis not present

## 2021-08-06 ENCOUNTER — Ambulatory Visit
Admission: RE | Admit: 2021-08-06 | Discharge: 2021-08-06 | Disposition: A | Discharge status: Home / Self Care | Payer: Federal, State, Local not specified - PPO | Source: Ambulatory Visit | Attending: Obstetrics and Gynecology | Admitting: Obstetrics and Gynecology

## 2021-08-06 ENCOUNTER — Other Ambulatory Visit: Payer: Self-pay | Admitting: Obstetrics and Gynecology

## 2021-08-06 DIAGNOSIS — N644 Mastodynia: Secondary | ICD-10-CM | POA: Diagnosis not present

## 2021-08-06 DIAGNOSIS — R928 Other abnormal and inconclusive findings on diagnostic imaging of breast: Secondary | ICD-10-CM

## 2021-08-06 DIAGNOSIS — N6489 Other specified disorders of breast: Secondary | ICD-10-CM | POA: Diagnosis not present

## 2021-08-11 ENCOUNTER — Other Ambulatory Visit: Payer: Federal, State, Local not specified - PPO

## 2021-10-05 DIAGNOSIS — N183 Chronic kidney disease, stage 3 unspecified: Secondary | ICD-10-CM | POA: Diagnosis not present

## 2021-10-05 DIAGNOSIS — E7800 Pure hypercholesterolemia, unspecified: Secondary | ICD-10-CM | POA: Diagnosis not present

## 2021-10-05 DIAGNOSIS — I10 Essential (primary) hypertension: Secondary | ICD-10-CM | POA: Diagnosis not present

## 2021-10-05 DIAGNOSIS — F324 Major depressive disorder, single episode, in partial remission: Secondary | ICD-10-CM | POA: Diagnosis not present

## 2021-10-22 DIAGNOSIS — K080 Exfoliation of teeth due to systemic causes: Secondary | ICD-10-CM | POA: Diagnosis not present

## 2021-11-03 DIAGNOSIS — N951 Menopausal and female climacteric states: Secondary | ICD-10-CM | POA: Diagnosis not present

## 2021-11-11 DIAGNOSIS — R61 Generalized hyperhidrosis: Secondary | ICD-10-CM | POA: Diagnosis not present

## 2022-01-29 DIAGNOSIS — D225 Melanocytic nevi of trunk: Secondary | ICD-10-CM | POA: Diagnosis not present

## 2022-01-29 DIAGNOSIS — L820 Inflamed seborrheic keratosis: Secondary | ICD-10-CM | POA: Diagnosis not present

## 2022-01-29 DIAGNOSIS — L720 Epidermal cyst: Secondary | ICD-10-CM | POA: Diagnosis not present

## 2022-01-29 DIAGNOSIS — L821 Other seborrheic keratosis: Secondary | ICD-10-CM | POA: Diagnosis not present

## 2022-02-26 DIAGNOSIS — M25571 Pain in right ankle and joints of right foot: Secondary | ICD-10-CM | POA: Diagnosis not present

## 2022-02-26 DIAGNOSIS — E559 Vitamin D deficiency, unspecified: Secondary | ICD-10-CM | POA: Diagnosis not present

## 2022-02-26 DIAGNOSIS — M2550 Pain in unspecified joint: Secondary | ICD-10-CM | POA: Diagnosis not present

## 2022-02-26 DIAGNOSIS — Z780 Asymptomatic menopausal state: Secondary | ICD-10-CM | POA: Diagnosis not present

## 2022-03-22 DIAGNOSIS — M79671 Pain in right foot: Secondary | ICD-10-CM | POA: Diagnosis not present

## 2022-03-22 DIAGNOSIS — M79672 Pain in left foot: Secondary | ICD-10-CM | POA: Diagnosis not present

## 2022-03-22 DIAGNOSIS — M25561 Pain in right knee: Secondary | ICD-10-CM | POA: Diagnosis not present

## 2022-03-22 DIAGNOSIS — M25562 Pain in left knee: Secondary | ICD-10-CM | POA: Diagnosis not present

## 2022-05-12 DIAGNOSIS — G7249 Other inflammatory and immune myopathies, not elsewhere classified: Secondary | ICD-10-CM | POA: Diagnosis not present

## 2022-07-05 DIAGNOSIS — Z1231 Encounter for screening mammogram for malignant neoplasm of breast: Secondary | ICD-10-CM | POA: Diagnosis not present

## 2022-07-12 DIAGNOSIS — N898 Other specified noninflammatory disorders of vagina: Secondary | ICD-10-CM | POA: Diagnosis not present

## 2022-07-12 DIAGNOSIS — Z6837 Body mass index (BMI) 37.0-37.9, adult: Secondary | ICD-10-CM | POA: Diagnosis not present

## 2022-07-12 DIAGNOSIS — N760 Acute vaginitis: Secondary | ICD-10-CM | POA: Diagnosis not present

## 2022-07-12 DIAGNOSIS — Z01419 Encounter for gynecological examination (general) (routine) without abnormal findings: Secondary | ICD-10-CM | POA: Diagnosis not present

## 2022-07-29 DIAGNOSIS — M79672 Pain in left foot: Secondary | ICD-10-CM | POA: Diagnosis not present

## 2022-07-29 DIAGNOSIS — M79671 Pain in right foot: Secondary | ICD-10-CM | POA: Diagnosis not present

## 2022-07-29 DIAGNOSIS — M1991 Primary osteoarthritis, unspecified site: Secondary | ICD-10-CM | POA: Diagnosis not present

## 2022-07-29 DIAGNOSIS — R5383 Other fatigue: Secondary | ICD-10-CM | POA: Diagnosis not present

## 2022-08-31 DIAGNOSIS — M25562 Pain in left knee: Secondary | ICD-10-CM | POA: Diagnosis not present

## 2022-08-31 DIAGNOSIS — M25561 Pain in right knee: Secondary | ICD-10-CM | POA: Diagnosis not present

## 2022-08-31 DIAGNOSIS — M1991 Primary osteoarthritis, unspecified site: Secondary | ICD-10-CM | POA: Diagnosis not present

## 2022-08-31 DIAGNOSIS — M79671 Pain in right foot: Secondary | ICD-10-CM | POA: Diagnosis not present

## 2022-10-22 DIAGNOSIS — Z0000 Encounter for general adult medical examination without abnormal findings: Secondary | ICD-10-CM | POA: Diagnosis not present

## 2022-10-22 DIAGNOSIS — I10 Essential (primary) hypertension: Secondary | ICD-10-CM | POA: Diagnosis not present

## 2022-10-22 DIAGNOSIS — E7800 Pure hypercholesterolemia, unspecified: Secondary | ICD-10-CM | POA: Diagnosis not present

## 2022-10-22 DIAGNOSIS — N183 Chronic kidney disease, stage 3 unspecified: Secondary | ICD-10-CM | POA: Diagnosis not present

## 2022-10-22 DIAGNOSIS — Z23 Encounter for immunization: Secondary | ICD-10-CM | POA: Diagnosis not present

## 2022-11-10 LAB — COLOGUARD: COLOGUARD: NEGATIVE

## 2022-12-15 DIAGNOSIS — B3731 Acute candidiasis of vulva and vagina: Secondary | ICD-10-CM | POA: Diagnosis not present

## 2023-02-09 DIAGNOSIS — D485 Neoplasm of uncertain behavior of skin: Secondary | ICD-10-CM | POA: Diagnosis not present

## 2023-02-09 DIAGNOSIS — D2271 Melanocytic nevi of right lower limb, including hip: Secondary | ICD-10-CM | POA: Diagnosis not present

## 2023-02-09 DIAGNOSIS — L821 Other seborrheic keratosis: Secondary | ICD-10-CM | POA: Diagnosis not present

## 2023-02-09 DIAGNOSIS — Z85828 Personal history of other malignant neoplasm of skin: Secondary | ICD-10-CM | POA: Diagnosis not present

## 2023-02-09 DIAGNOSIS — D225 Melanocytic nevi of trunk: Secondary | ICD-10-CM | POA: Diagnosis not present

## 2023-02-09 DIAGNOSIS — L578 Other skin changes due to chronic exposure to nonionizing radiation: Secondary | ICD-10-CM | POA: Diagnosis not present

## 2023-03-18 ENCOUNTER — Emergency Department (HOSPITAL_BASED_OUTPATIENT_CLINIC_OR_DEPARTMENT_OTHER): Payer: Federal, State, Local not specified - PPO

## 2023-03-18 ENCOUNTER — Other Ambulatory Visit: Payer: Self-pay

## 2023-03-18 ENCOUNTER — Emergency Department (HOSPITAL_BASED_OUTPATIENT_CLINIC_OR_DEPARTMENT_OTHER)
Admission: EM | Admit: 2023-03-18 | Discharge: 2023-03-18 | Disposition: A | Discharge status: Home / Self Care | Payer: Federal, State, Local not specified - PPO | Attending: Emergency Medicine | Admitting: Emergency Medicine

## 2023-03-18 ENCOUNTER — Encounter (HOSPITAL_BASED_OUTPATIENT_CLINIC_OR_DEPARTMENT_OTHER): Payer: Self-pay

## 2023-03-18 DIAGNOSIS — W19XXXA Unspecified fall, initial encounter: Secondary | ICD-10-CM | POA: Diagnosis not present

## 2023-03-18 DIAGNOSIS — S93402A Sprain of unspecified ligament of left ankle, initial encounter: Secondary | ICD-10-CM | POA: Diagnosis not present

## 2023-03-18 DIAGNOSIS — M7732 Calcaneal spur, left foot: Secondary | ICD-10-CM | POA: Diagnosis not present

## 2023-03-18 DIAGNOSIS — S99912A Unspecified injury of left ankle, initial encounter: Secondary | ICD-10-CM | POA: Diagnosis not present

## 2023-03-18 DIAGNOSIS — M25572 Pain in left ankle and joints of left foot: Secondary | ICD-10-CM | POA: Diagnosis not present

## 2023-03-18 MED ORDER — NAPROXEN 500 MG PO TABS: 500.0000 mg | ORAL_TABLET | Freq: Two times a day (BID) | ORAL | 0 refills | Status: AC

## 2023-03-18 NOTE — ED Triage Notes (Signed)
Pt reports L ankle pain after a fall tonight. No LOC. Did not hit head.

## 2023-03-18 NOTE — Discharge Instructions (Signed)
You were given a prescription for anti-inflammatories, please take this medication to help with pain control.  Take 1 tablet twice a day with food for the next 7 days.  You may also apply ice, heat, elevate your left ankle.  You were given the phone number to Dr. Everardo Pacific, please schedule an appointment if you do not experience any improvement in symptoms after 1 week.

## 2023-03-18 NOTE — ED Provider Notes (Signed)
[facility] Provider Note   CSN: [id number] Arrival date & time: 03/18/23  2216     History  Chief Complaint  Patient presents with   Ankle Pain    Allison Mullins is a 52 y.o. female.  52 year old female with no past medical history presents to the ED with a chief complaint of left ankle pain which occurred status post mechanical fall this evening.  Reports pain along the lateral aspect that is related with weightbearing along with plantarflexion, has not taken anything for pain control.  No alleviating factors.  When she fell she did not strike her head, there is no other injuries reported.  The history is provided by the patient.  Ankle Pain Associated symptoms: no fever        Home Medications Prior to Admission medications   Medication Sig Start Date End Date Taking? Authorizing Provider  buPROPion (WELLBUTRIN SR) 150 MG 12 hr tablet Take 1 tablet by mouth Daily. 05/04/12   [provider]  JUNEL 1/20 1-20 MG-MCG tablet Take 1 tablet by mouth Daily. 06/15/12   [provider]  omeprazole (PRILOSEC) 40 MG capsule TAKE ONE CAPSULE BY MOUTH EVERY DAY 06/15/13   Clance, Maree Krabbe, MD      Allergies    Patient has no known allergies.    Review of Systems   Review of Systems  Constitutional:  Negative for fever.  Musculoskeletal:  Positive for arthralgias.    Physical Exam Updated Vital Signs BP 125/64 (BP Location: Right Arm)   Pulse 74   Temp 97.7 F (36.5 C) (Oral)   Resp 16   Ht 5\' 5"  (1.651 m)   Wt 99.8 kg   SpO2 100%   BMI 36.61 kg/m  Physical Exam Vitals and nursing note reviewed.  Constitutional:      Appearance: Normal appearance.  HENT:     Head: Normocephalic and atraumatic.     Mouth/Throat:     Mouth: Mucous membranes are moist.  Cardiovascular:     Rate and Rhythm: Normal rate.  Pulmonary:     Effort: Pulmonary effort is normal.  Abdominal:     General: Abdomen is flat.   Musculoskeletal:     Cervical back: Normal range of motion and neck supple.     Left ankle: No swelling or lacerations. Tenderness present over the lateral malleolus. Normal range of motion.     Comments: 2+ DP, PT pulses tenderness to palpation along the lateral malleolus with some swelling noted.  Good dorsiflexion and plantarflexion, capillary refill is intact and sensation is intact throughout.  Skin:    General: Skin is warm and dry.  Neurological:     Mental Status: She is alert and oriented to person, place, and time.     ED Results / Procedures / Treatments   Labs (all labs ordered are listed, but only abnormal results are displayed) Labs Reviewed - No data to display  EKG None  Radiology DG Ankle Complete Left  Result Date: 03/18/2023 CLINICAL DATA:  Left ankle pain after fall tonight.  Trauma. EXAM: LEFT ANKLE COMPLETE - 3+ VIEW COMPARISON:  None Available. FINDINGS: There is no evidence of fracture, dislocation, or joint effusion. Normal ankle mortise. Intact talar dome. Intact base of the fifth metatarsal. Small plantar calcaneal spur. There is no evidence of arthropathy or other focal bone abnormality. Soft tissues are unremarkable. IMPRESSION: No fracture or subluxation of the left ankle. Electronically Signed   By: Shawna Orleans  San[place] M.D.   On: 03/18/2023 22:58    Procedures Procedures    Medications Ordered in ED Medications - No data to display  ED Course/ Medical Decision Making/ A&P                                 Medical Decision Making Amount and/or Complexity of Data Reviewed Radiology: ordered.   Patient presents to the ED status post mechanical fall this evening, endorsing pain along the lateral aspect of her left ankle.  Some swelling is noted, neurovascularly intact.  X-ray did not show any acute fracture or dislocation.  Suspect likely strain she was wearing a brace prior to arrival in the ED, she is weightbearing, we discussed anti-inflammatories,  will follow-up with orthopedics if symptoms do not improve after 1 week.  Patient is hemodynamically stable for discharge.  Portions of this note were generated with Scientist, clinical (histocompatibility and immunogenetics). Dictation errors may occur despite best attempts at proofreading.   Final Clinical Impression(s) / ED Diagnoses Final diagnoses:  Sprain of left ankle, unspecified ligament, initial encounter    Rx / DC Orders ED Discharge Orders     None         Claude Manges, PA-C 03/18/23 2319    Glyn Ade, MD 03/21/23 1452

## 2023-03-24 DIAGNOSIS — M25572 Pain in left ankle and joints of left foot: Secondary | ICD-10-CM | POA: Diagnosis not present

## 2023-04-07 DIAGNOSIS — M25572 Pain in left ankle and joints of left foot: Secondary | ICD-10-CM | POA: Diagnosis not present

## 2023-04-26 DIAGNOSIS — M25672 Stiffness of left ankle, not elsewhere classified: Secondary | ICD-10-CM | POA: Diagnosis not present

## 2023-05-04 DIAGNOSIS — M25672 Stiffness of left ankle, not elsewhere classified: Secondary | ICD-10-CM | POA: Diagnosis not present

## 2023-05-04 DIAGNOSIS — M25572 Pain in left ankle and joints of left foot: Secondary | ICD-10-CM | POA: Diagnosis not present

## 2023-05-11 DIAGNOSIS — M25672 Stiffness of left ankle, not elsewhere classified: Secondary | ICD-10-CM | POA: Diagnosis not present

## 2023-05-11 DIAGNOSIS — M25572 Pain in left ankle and joints of left foot: Secondary | ICD-10-CM | POA: Diagnosis not present

## 2023-05-16 DIAGNOSIS — M25672 Stiffness of left ankle, not elsewhere classified: Secondary | ICD-10-CM | POA: Diagnosis not present

## 2023-05-16 DIAGNOSIS — M25572 Pain in left ankle and joints of left foot: Secondary | ICD-10-CM | POA: Diagnosis not present

## 2023-05-23 DIAGNOSIS — M25572 Pain in left ankle and joints of left foot: Secondary | ICD-10-CM | POA: Diagnosis not present

## 2023-05-23 DIAGNOSIS — M25672 Stiffness of left ankle, not elsewhere classified: Secondary | ICD-10-CM | POA: Diagnosis not present

## 2023-05-27 DIAGNOSIS — R9389 Abnormal findings on diagnostic imaging of other specified body structures: Secondary | ICD-10-CM | POA: Diagnosis not present

## 2023-05-27 DIAGNOSIS — B3731 Acute candidiasis of vulva and vagina: Secondary | ICD-10-CM | POA: Diagnosis not present

## 2023-05-27 DIAGNOSIS — N950 Postmenopausal bleeding: Secondary | ICD-10-CM | POA: Diagnosis not present

## 2023-05-30 DIAGNOSIS — M25572 Pain in left ankle and joints of left foot: Secondary | ICD-10-CM | POA: Diagnosis not present

## 2023-05-30 DIAGNOSIS — M25672 Stiffness of left ankle, not elsewhere classified: Secondary | ICD-10-CM | POA: Diagnosis not present

## 2023-06-01 DIAGNOSIS — M25572 Pain in left ankle and joints of left foot: Secondary | ICD-10-CM | POA: Diagnosis not present

## 2023-06-09 DIAGNOSIS — R9389 Abnormal findings on diagnostic imaging of other specified body structures: Secondary | ICD-10-CM | POA: Diagnosis not present

## 2023-06-17 DIAGNOSIS — M25572 Pain in left ankle and joints of left foot: Secondary | ICD-10-CM | POA: Diagnosis not present

## 2023-07-08 DIAGNOSIS — S93492A Sprain of other ligament of left ankle, initial encounter: Secondary | ICD-10-CM | POA: Diagnosis not present

## 2023-07-21 DIAGNOSIS — N941 Unspecified dyspareunia: Secondary | ICD-10-CM | POA: Diagnosis not present

## 2023-07-21 DIAGNOSIS — R61 Generalized hyperhidrosis: Secondary | ICD-10-CM | POA: Diagnosis not present

## 2023-07-21 DIAGNOSIS — Z01411 Encounter for gynecological examination (general) (routine) with abnormal findings: Secondary | ICD-10-CM | POA: Diagnosis not present

## 2023-07-21 DIAGNOSIS — B3731 Acute candidiasis of vulva and vagina: Secondary | ICD-10-CM | POA: Diagnosis not present

## 2023-07-21 DIAGNOSIS — Z832 Family history of diseases of the blood and blood-forming organs and certain disorders involving the immune mechanism: Secondary | ICD-10-CM | POA: Diagnosis not present

## 2023-07-21 DIAGNOSIS — R635 Abnormal weight gain: Secondary | ICD-10-CM | POA: Diagnosis not present

## 2023-07-21 DIAGNOSIS — Z1231 Encounter for screening mammogram for malignant neoplasm of breast: Secondary | ICD-10-CM | POA: Diagnosis not present

## 2023-07-21 DIAGNOSIS — Z1331 Encounter for screening for depression: Secondary | ICD-10-CM | POA: Diagnosis not present

## 2023-08-15 DIAGNOSIS — N840 Polyp of corpus uteri: Secondary | ICD-10-CM | POA: Diagnosis not present

## 2023-10-21 ENCOUNTER — Other Ambulatory Visit: Payer: Self-pay | Admitting: Family Medicine

## 2023-10-21 DIAGNOSIS — N644 Mastodynia: Secondary | ICD-10-CM

## 2023-10-24 ENCOUNTER — Encounter: Payer: Self-pay | Admitting: Family Medicine

## 2023-10-26 ENCOUNTER — Ambulatory Visit
Admission: RE | Admit: 2023-10-26 | Discharge: 2023-10-26 | Disposition: A | Discharge status: Home / Self Care | Source: Ambulatory Visit | Attending: Family Medicine | Admitting: Family Medicine

## 2023-10-26 ENCOUNTER — Other Ambulatory Visit: Payer: Self-pay | Admitting: Family Medicine

## 2023-10-26 DIAGNOSIS — N644 Mastodynia: Secondary | ICD-10-CM

## 2024-12-13 ENCOUNTER — Ambulatory Visit: Admitting: Diagnostic Neuroimaging
